# Patient Record
Sex: Female | Born: 1970 | Race: White | Hispanic: No | Marital: Married | Smoking: Never smoker
Health system: Southern US, Community
[De-identification: ages and names within clinical notes are randomized; demographics above are authoritative.]

## PROBLEM LIST (undated history)

## (undated) DIAGNOSIS — N200 Calculus of kidney: Secondary | ICD-10-CM

## (undated) DIAGNOSIS — G43909 Migraine, unspecified, not intractable, without status migrainosus: Secondary | ICD-10-CM

## (undated) DIAGNOSIS — E039 Hypothyroidism, unspecified: Secondary | ICD-10-CM

## (undated) DIAGNOSIS — IMO0002 Reserved for concepts with insufficient information to code with codable children: Secondary | ICD-10-CM

## (undated) DIAGNOSIS — Z1231 Encounter for screening mammogram for malignant neoplasm of breast: Secondary | ICD-10-CM

## (undated) DIAGNOSIS — J45909 Unspecified asthma, uncomplicated: Secondary | ICD-10-CM

## (undated) DIAGNOSIS — J984 Other disorders of lung: Secondary | ICD-10-CM

## (undated) DIAGNOSIS — R7989 Other specified abnormal findings of blood chemistry: Secondary | ICD-10-CM

## (undated) DIAGNOSIS — G43009 Migraine without aura, not intractable, without status migrainosus: Secondary | ICD-10-CM

## (undated) DIAGNOSIS — M79645 Pain in left finger(s): Secondary | ICD-10-CM

## (undated) DIAGNOSIS — Z23 Encounter for immunization: Secondary | ICD-10-CM

## (undated) HISTORY — DX: Calculus of kidney: N20.0

## (undated) HISTORY — DX: Migraine, unspecified, not intractable, without status migrainosus: G43.909

## (undated) HISTORY — DX: Hypothyroidism, unspecified: E03.9

## (undated) HISTORY — PX: TONSILLECTOMY: SHX5217

## (undated) HISTORY — PX: CHOLECYSTECTOMY: SHX55

---

## 1999-01-15 ENCOUNTER — Inpatient Hospital Stay (HOSPITAL_COMMUNITY): Admission: AD | Admit: 1999-01-15 | Discharge: 1999-01-15 | Payer: Self-pay | Admitting: Obstetrics and Gynecology

## 1999-01-19 ENCOUNTER — Inpatient Hospital Stay (HOSPITAL_COMMUNITY): Admission: AD | Admit: 1999-01-19 | Discharge: 1999-01-21 | Payer: Self-pay | Admitting: Obstetrics and Gynecology

## 1999-04-12 ENCOUNTER — Encounter (INDEPENDENT_AMBULATORY_CARE_PROVIDER_SITE_OTHER): Payer: Self-pay

## 1999-04-12 ENCOUNTER — Ambulatory Visit (HOSPITAL_BASED_OUTPATIENT_CLINIC_OR_DEPARTMENT_OTHER): Admission: RE | Admit: 1999-04-12 | Discharge: 1999-04-12 | Payer: Self-pay | Admitting: Orthopedic Surgery

## 2000-02-27 ENCOUNTER — Encounter: Admission: RE | Admit: 2000-02-27 | Discharge: 2000-03-07 | Payer: Self-pay | Admitting: Family Medicine

## 2000-03-21 ENCOUNTER — Other Ambulatory Visit: Admission: RE | Admit: 2000-03-21 | Discharge: 2000-03-21 | Payer: Self-pay | Admitting: Obstetrics and Gynecology

## 2000-10-08 ENCOUNTER — Encounter: Payer: Self-pay | Admitting: *Deleted

## 2000-10-08 ENCOUNTER — Encounter: Admission: RE | Admit: 2000-10-08 | Discharge: 2000-10-08 | Payer: Self-pay | Admitting: *Deleted

## 2001-03-25 ENCOUNTER — Other Ambulatory Visit: Admission: RE | Admit: 2001-03-25 | Discharge: 2001-03-25 | Payer: Self-pay | Admitting: Obstetrics and Gynecology

## 2001-10-08 ENCOUNTER — Encounter: Payer: Self-pay | Admitting: Family Medicine

## 2001-10-08 ENCOUNTER — Encounter: Admission: RE | Admit: 2001-10-08 | Discharge: 2001-10-08 | Payer: Self-pay | Admitting: Family Medicine

## 2001-12-02 ENCOUNTER — Ambulatory Visit (HOSPITAL_COMMUNITY): Admission: RE | Admit: 2001-12-02 | Discharge: 2001-12-02 | Payer: Self-pay | Admitting: Surgery

## 2001-12-02 ENCOUNTER — Encounter: Payer: Self-pay | Admitting: Surgery

## 2002-04-09 ENCOUNTER — Other Ambulatory Visit: Admission: RE | Admit: 2002-04-09 | Discharge: 2002-04-09 | Payer: Self-pay | Admitting: Obstetrics and Gynecology

## 2002-06-15 ENCOUNTER — Other Ambulatory Visit: Admission: RE | Admit: 2002-06-15 | Discharge: 2002-06-15 | Payer: Self-pay | Admitting: Obstetrics and Gynecology

## 2002-12-18 ENCOUNTER — Inpatient Hospital Stay (HOSPITAL_COMMUNITY): Admission: AD | Admit: 2002-12-18 | Discharge: 2002-12-19 | Payer: Self-pay | Admitting: Obstetrics and Gynecology

## 2002-12-25 ENCOUNTER — Inpatient Hospital Stay (HOSPITAL_COMMUNITY): Admission: AD | Admit: 2002-12-25 | Discharge: 2002-12-27 | Payer: Self-pay | Admitting: Obstetrics and Gynecology

## 2003-02-05 ENCOUNTER — Other Ambulatory Visit: Admission: RE | Admit: 2003-02-05 | Discharge: 2003-02-05 | Payer: Self-pay | Admitting: Obstetrics and Gynecology

## 2005-11-22 ENCOUNTER — Ambulatory Visit: Payer: Self-pay | Admitting: Family Medicine

## 2005-11-24 ENCOUNTER — Ambulatory Visit: Payer: Self-pay | Admitting: Internal Medicine

## 2005-11-26 ENCOUNTER — Ambulatory Visit: Payer: Self-pay | Admitting: Family Medicine

## 2006-01-03 ENCOUNTER — Ambulatory Visit: Payer: Self-pay | Admitting: Family Medicine

## 2006-05-01 ENCOUNTER — Ambulatory Visit: Payer: Self-pay | Admitting: Internal Medicine

## 2006-05-18 DIAGNOSIS — E039 Hypothyroidism, unspecified: Secondary | ICD-10-CM | POA: Insufficient documentation

## 2006-05-18 DIAGNOSIS — J309 Allergic rhinitis, unspecified: Secondary | ICD-10-CM | POA: Insufficient documentation

## 2006-05-28 ENCOUNTER — Ambulatory Visit: Payer: Self-pay | Admitting: Family Medicine

## 2006-05-28 LAB — CONVERTED CEMR LAB: TSH: 3.34 microintl units/mL (ref 0.35–5.50)

## 2006-06-26 ENCOUNTER — Encounter: Admission: RE | Admit: 2006-06-26 | Discharge: 2006-06-26 | Payer: Self-pay | Admitting: Obstetrics and Gynecology

## 2006-08-20 ENCOUNTER — Ambulatory Visit: Payer: Self-pay | Admitting: Family Medicine

## 2006-12-30 ENCOUNTER — Ambulatory Visit: Payer: Self-pay | Admitting: Family Medicine

## 2006-12-30 DIAGNOSIS — G43009 Migraine without aura, not intractable, without status migrainosus: Secondary | ICD-10-CM | POA: Insufficient documentation

## 2007-01-02 ENCOUNTER — Telehealth (INDEPENDENT_AMBULATORY_CARE_PROVIDER_SITE_OTHER): Payer: Self-pay | Admitting: *Deleted

## 2007-01-03 ENCOUNTER — Encounter: Admission: RE | Admit: 2007-01-03 | Discharge: 2007-01-03 | Payer: Self-pay | Admitting: Family Medicine

## 2007-01-07 ENCOUNTER — Telehealth (INDEPENDENT_AMBULATORY_CARE_PROVIDER_SITE_OTHER): Payer: Self-pay | Admitting: *Deleted

## 2007-01-10 ENCOUNTER — Ambulatory Visit: Payer: Self-pay | Admitting: Family Medicine

## 2007-01-13 ENCOUNTER — Encounter (INDEPENDENT_AMBULATORY_CARE_PROVIDER_SITE_OTHER): Payer: Self-pay | Admitting: Family Medicine

## 2007-01-15 ENCOUNTER — Encounter (INDEPENDENT_AMBULATORY_CARE_PROVIDER_SITE_OTHER): Payer: Self-pay | Admitting: Family Medicine

## 2007-02-25 ENCOUNTER — Encounter (INDEPENDENT_AMBULATORY_CARE_PROVIDER_SITE_OTHER): Payer: Self-pay | Admitting: Family Medicine

## 2007-03-17 ENCOUNTER — Telehealth (INDEPENDENT_AMBULATORY_CARE_PROVIDER_SITE_OTHER): Payer: Self-pay | Admitting: *Deleted

## 2007-05-05 ENCOUNTER — Ambulatory Visit: Payer: Self-pay | Admitting: Family Medicine

## 2007-05-05 ENCOUNTER — Telehealth (INDEPENDENT_AMBULATORY_CARE_PROVIDER_SITE_OTHER): Payer: Self-pay | Admitting: *Deleted

## 2007-06-02 ENCOUNTER — Telehealth (INDEPENDENT_AMBULATORY_CARE_PROVIDER_SITE_OTHER): Payer: Self-pay | Admitting: *Deleted

## 2007-06-11 DIAGNOSIS — K5900 Constipation, unspecified: Secondary | ICD-10-CM | POA: Insufficient documentation

## 2007-06-16 ENCOUNTER — Telehealth (INDEPENDENT_AMBULATORY_CARE_PROVIDER_SITE_OTHER): Payer: Self-pay | Admitting: *Deleted

## 2007-06-27 ENCOUNTER — Telehealth (INDEPENDENT_AMBULATORY_CARE_PROVIDER_SITE_OTHER): Payer: Self-pay | Admitting: *Deleted

## 2007-12-25 ENCOUNTER — Ambulatory Visit: Payer: Self-pay | Admitting: *Deleted

## 2007-12-25 DIAGNOSIS — E669 Obesity, unspecified: Secondary | ICD-10-CM | POA: Insufficient documentation

## 2007-12-25 LAB — CONVERTED CEMR LAB: TSH: 2.51 microintl units/mL (ref 0.35–5.50)

## 2008-01-27 ENCOUNTER — Ambulatory Visit: Payer: Self-pay | Admitting: *Deleted

## 2008-02-02 ENCOUNTER — Ambulatory Visit: Payer: Self-pay | Admitting: Internal Medicine

## 2008-02-02 DIAGNOSIS — R070 Pain in throat: Secondary | ICD-10-CM | POA: Insufficient documentation

## 2008-02-02 DIAGNOSIS — J069 Acute upper respiratory infection, unspecified: Secondary | ICD-10-CM | POA: Insufficient documentation

## 2008-02-06 ENCOUNTER — Telehealth (INDEPENDENT_AMBULATORY_CARE_PROVIDER_SITE_OTHER): Payer: Self-pay | Admitting: *Deleted

## 2008-02-10 ENCOUNTER — Ambulatory Visit: Payer: Self-pay | Admitting: *Deleted

## 2008-02-10 DIAGNOSIS — J45909 Unspecified asthma, uncomplicated: Secondary | ICD-10-CM | POA: Insufficient documentation

## 2008-02-10 DIAGNOSIS — J40 Bronchitis, not specified as acute or chronic: Secondary | ICD-10-CM | POA: Insufficient documentation

## 2008-02-20 ENCOUNTER — Ambulatory Visit: Payer: Self-pay | Admitting: Internal Medicine

## 2008-02-20 ENCOUNTER — Ambulatory Visit (HOSPITAL_BASED_OUTPATIENT_CLINIC_OR_DEPARTMENT_OTHER): Admission: RE | Admit: 2008-02-20 | Discharge: 2008-02-20 | Payer: Self-pay | Admitting: Internal Medicine

## 2008-06-07 ENCOUNTER — Ambulatory Visit: Payer: Self-pay | Admitting: *Deleted

## 2008-06-07 DIAGNOSIS — J329 Chronic sinusitis, unspecified: Secondary | ICD-10-CM | POA: Insufficient documentation

## 2008-11-08 ENCOUNTER — Ambulatory Visit: Payer: Self-pay | Admitting: Internal Medicine

## 2008-11-08 DIAGNOSIS — J029 Acute pharyngitis, unspecified: Secondary | ICD-10-CM | POA: Insufficient documentation

## 2008-12-26 ENCOUNTER — Emergency Department (HOSPITAL_BASED_OUTPATIENT_CLINIC_OR_DEPARTMENT_OTHER): Admission: EM | Admit: 2008-12-26 | Discharge: 2008-12-26 | Payer: Self-pay | Admitting: Emergency Medicine

## 2008-12-26 ENCOUNTER — Ambulatory Visit: Payer: Self-pay | Admitting: Diagnostic Radiology

## 2009-01-03 ENCOUNTER — Encounter: Payer: Self-pay | Admitting: Internal Medicine

## 2009-01-11 ENCOUNTER — Telehealth: Payer: Self-pay | Admitting: Internal Medicine

## 2009-01-17 ENCOUNTER — Encounter: Payer: Self-pay | Admitting: Internal Medicine

## 2009-01-17 LAB — CONVERTED CEMR LAB: TSH: 3.431 microintl units/mL (ref 0.350–4.500)

## 2009-01-18 ENCOUNTER — Ambulatory Visit: Payer: Self-pay | Admitting: Internal Medicine

## 2009-01-20 ENCOUNTER — Ambulatory Visit: Payer: Self-pay | Admitting: Internal Medicine

## 2009-03-22 ENCOUNTER — Telehealth: Payer: Self-pay | Admitting: Internal Medicine

## 2009-04-05 ENCOUNTER — Encounter: Payer: Self-pay | Admitting: Internal Medicine

## 2009-04-05 LAB — CONVERTED CEMR LAB
AST: 15 units/L (ref 0–37)
Albumin: 4 g/dL (ref 3.5–5.2)
Alkaline Phosphatase: 76 units/L (ref 39–117)
Calcium: 9.2 mg/dL (ref 8.4–10.5)
Chloride: 104 meq/L (ref 96–112)
Creatinine, Ser: 0.68 mg/dL (ref 0.40–1.20)
HDL: 44 mg/dL (ref >39)
LDL Cholesterol: 79 mg/dL (ref 0–99)
Sodium: 140 meq/L (ref 135–145)
TSH: 0.577 microintl units/mL (ref 0.350–4.500)
Total CHOL/HDL Ratio: 4
Total Protein: 6.8 g/dL (ref 6.0–8.3)
Triglycerides: 276 mg/dL — ABNORMAL HIGH (ref <150)

## 2009-04-13 ENCOUNTER — Telehealth (INDEPENDENT_AMBULATORY_CARE_PROVIDER_SITE_OTHER): Payer: Self-pay | Admitting: *Deleted

## 2009-04-21 ENCOUNTER — Ambulatory Visit: Payer: Self-pay | Admitting: Internal Medicine

## 2009-04-21 DIAGNOSIS — M79609 Pain in unspecified limb: Secondary | ICD-10-CM | POA: Insufficient documentation

## 2009-04-27 ENCOUNTER — Ambulatory Visit (HOSPITAL_BASED_OUTPATIENT_CLINIC_OR_DEPARTMENT_OTHER): Admission: RE | Admit: 2009-04-27 | Discharge: 2009-04-27 | Payer: Self-pay | Admitting: Internal Medicine

## 2009-04-27 ENCOUNTER — Ambulatory Visit: Payer: Self-pay | Admitting: Diagnostic Radiology

## 2009-04-28 ENCOUNTER — Telehealth: Payer: Self-pay | Admitting: Internal Medicine

## 2009-08-16 ENCOUNTER — Telehealth: Payer: Self-pay | Admitting: Internal Medicine

## 2009-08-16 ENCOUNTER — Ambulatory Visit: Payer: Self-pay | Admitting: Internal Medicine

## 2009-12-29 ENCOUNTER — Encounter: Admission: RE | Admit: 2009-12-29 | Discharge: 2009-12-29 | Payer: Self-pay | Admitting: Obstetrics and Gynecology

## 2010-01-05 ENCOUNTER — Encounter: Admission: RE | Admit: 2010-01-05 | Discharge: 2010-01-05 | Payer: Self-pay | Admitting: Obstetrics and Gynecology

## 2010-01-26 ENCOUNTER — Encounter: Payer: Self-pay | Admitting: Internal Medicine

## 2010-01-27 ENCOUNTER — Encounter: Payer: Self-pay | Admitting: Internal Medicine

## 2010-02-02 ENCOUNTER — Telehealth: Payer: Self-pay | Admitting: Internal Medicine

## 2010-05-16 IMAGING — CR DG CHEST 2V
2 series · 2 of 2 positions shown · non-contrast
Comparison: None

CLINICAL DATA: Cough for several weeks

CHEST - 2 VIEW

[w chest pa]
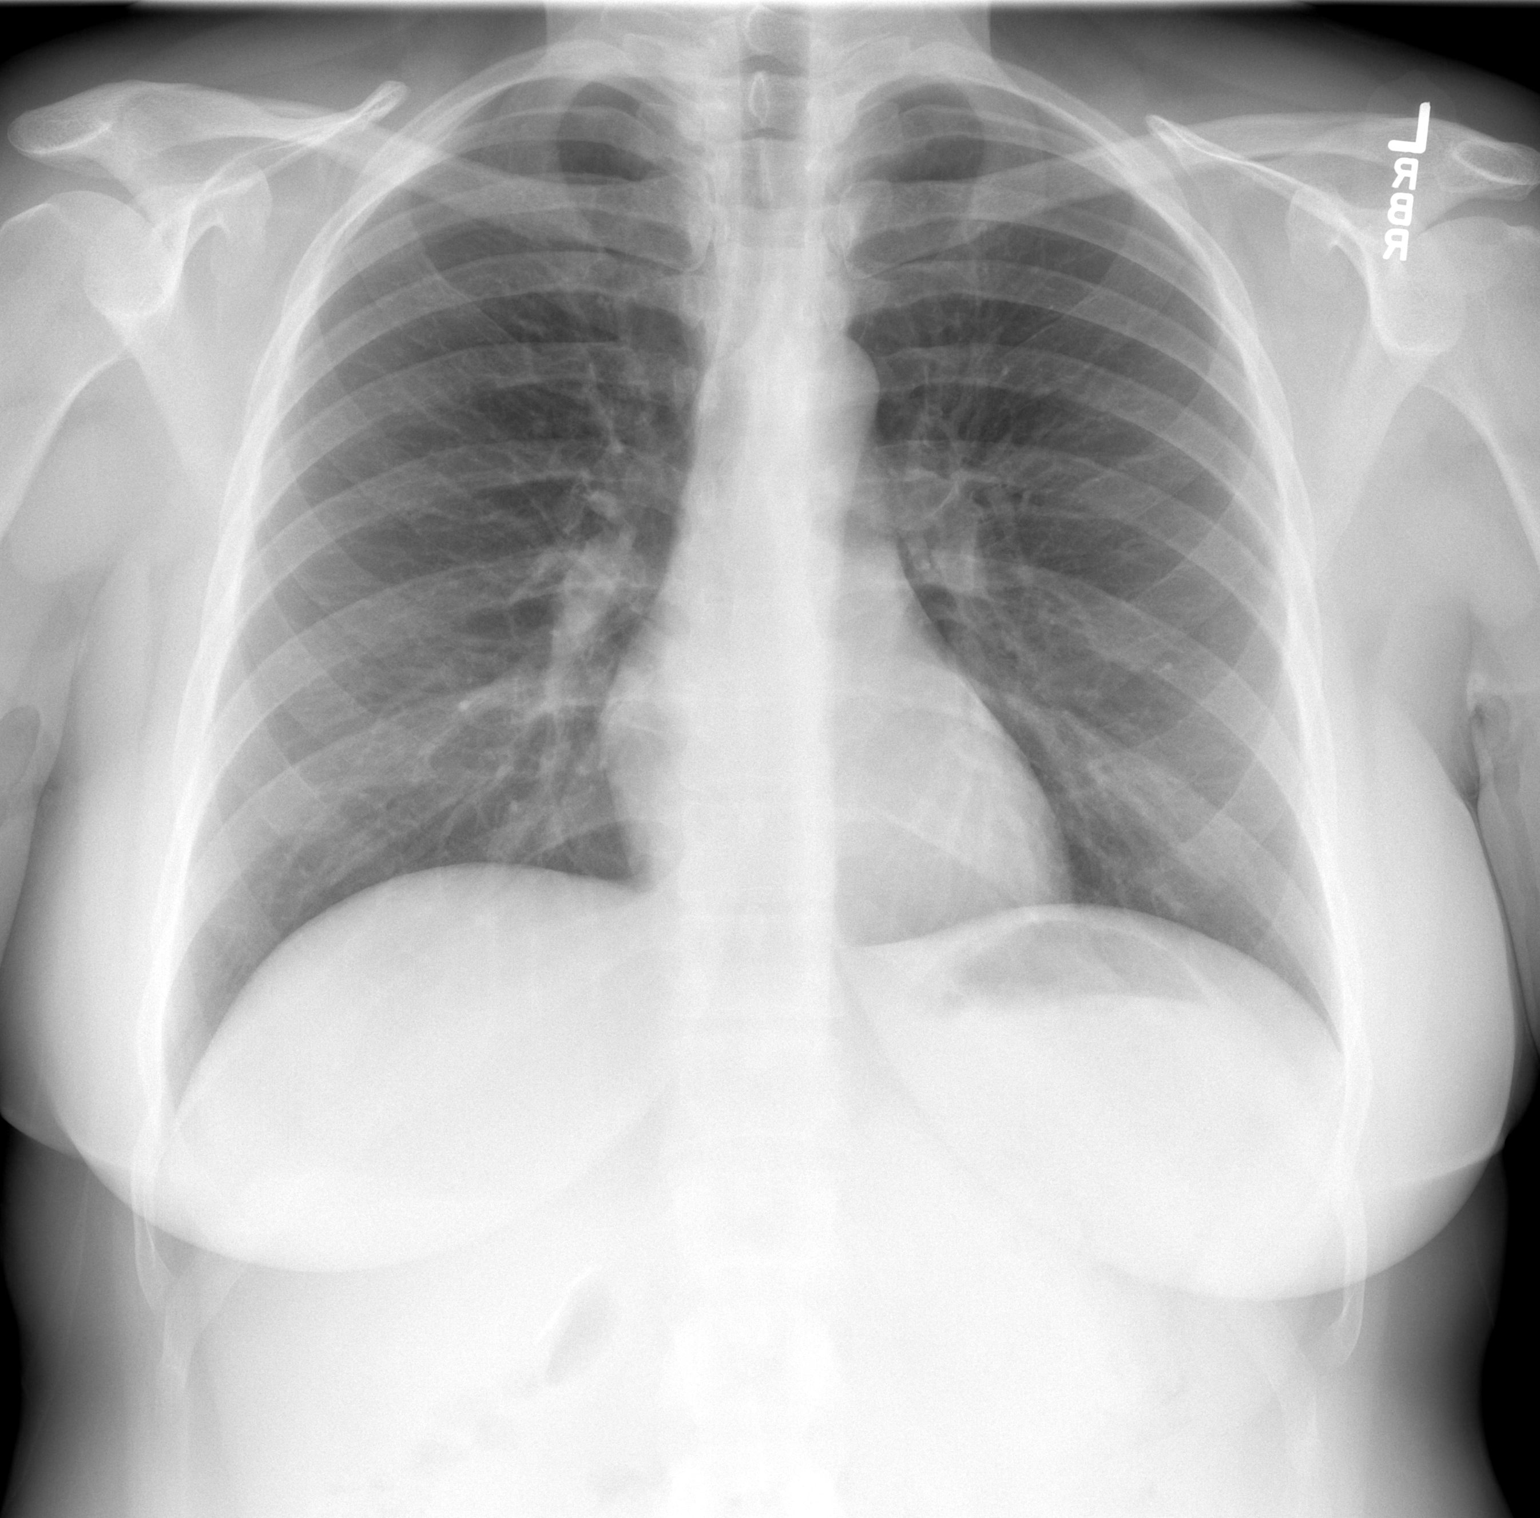

[w chest lat]
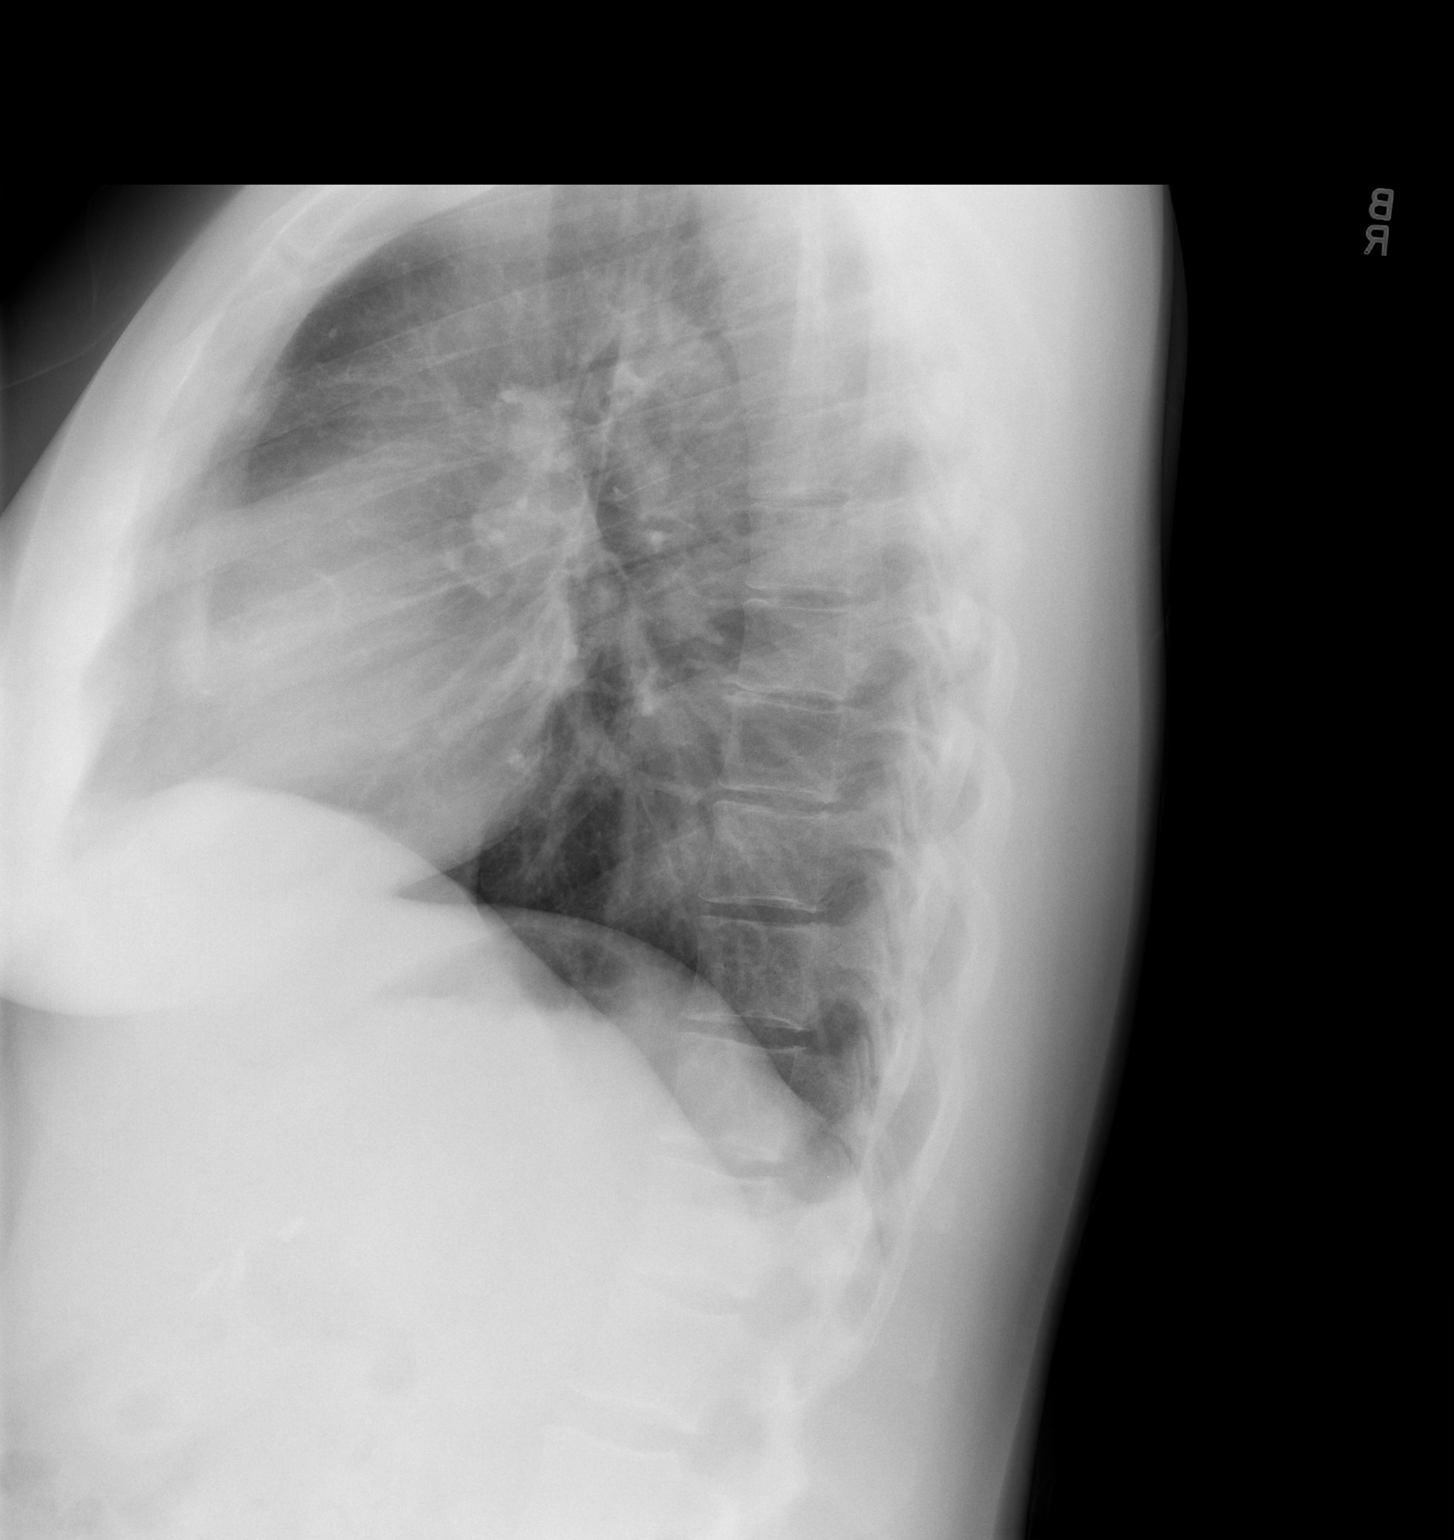

[2 of 2 positions shown; findings below may reference images not displayed]

FINDINGS: No active infiltrate or effusion is seen.  The heart is
within normal limits in size.  No bony abnormality is noted.
Surgical clips are present in the right upper quadrant from prior
cholecystectomy.
IMPRESSION: No active lung disease.

## 2010-05-23 NOTE — Assessment & Plan Note (Signed)
Summary: MEDICATION REFILL/HEA   Vital Signs:  Patient profile:   40 year old female Height:      62 inches Weight:      192.25 pounds BMI:     35.29 O2 Sat:      100 % on Room air Temp:     98.4 degrees F oral Pulse rate:   80 / minute Pulse rhythm:   regular Resp:     16 per minute BP sitting:   106 / 80  (right arm) Cuff size:   large  Vitals Entered By: Glendell Docker CMA (August 16, 2009 8:57 AM)  O2 Flow:  Room air CC: Rm 2- Follow up disease management   Primary Care Provider:  Dondra Spry DO  CC:  Rm 2- Follow up disease management.  History of Present Illness: 40 y/o white female with hypothyroidism for f.u pt frustrated with wt gain busy family life  heel pain - manageable.    migraine headache - usually infreq.  sometimes induced by exercise   Allergies: 1)  ! 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   Lungs:  normal respiratory effort and normal breath sounds.   Heart:  normal rate, regular rhythm, and no gallop.   Extremities:  No lower extremity edema    Impression & Recommendations:  Problem # 1:  HEEL PAIN, LEFT (ICD-729.5) Assessment Unchanged MRI showed heel spur.  She tries her best to use heel inserts.  pain is stable.  orthopedic referral deferred.  Problem # 2:  HYPOTHYROIDISM (ICD-244.9) she is frustrated by wt gain. monitor TSH.  wt loss strategies  reviewed.  Her updated medication list for this problem includes:    Synthroid 100 Mcg Tabs (Levothyroxine sodium) ..... 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  pt advised to try taking 400-600 mg of ibuprofen before exercise.  Her updated medication list for this problem includes:    Maxalt 10 Mg Tabs (Rizatriptan benzoate) .Marland Kitchen... Take one tablet at onset of headache.  Complete Medication List: 1)  Synthroid 100 Mcg Tabs (Levothyroxine sodium) .... One by mouth qd 2)  Zyrtec Allergy 10 Mg Tabs (Cetirizine hcl) .... Take 1 tablet by mouth once a day 3)  Maxalt 10 Mg Tabs (Rizatriptan benzoate) .... Take one tablet at onset of headache. 4)  Femcon Fe 0.4-35 Mg-mcg Chew (Norethin-eth estradiol-fe) .... Take 1 tablet by mouth once a day  Patient Instructions: 1)  Please schedule a follow-up appointment in 1 year. 2)  [email or web address] 3)  Schedule TSH every 6 months Prescriptions: SYNTHROID 100 MCG TABS (LEVOTHYROXINE SODIUM) one by mouth qd Brand medically necessary #90 x 3   Entered and Authorized by:   D. Thomos Lemons DO   Signed by:   D. Thomos Lemons DO on 08/16/2009   Method used:   Electronically to  [facility] (retail)       [address]       [place], [place]  [address]       Ph: [phone]       Fax: [phone]   RxID:   [id number]    Immunization History:  Tetanus/Td Immunization History:    Tetanus/Td:  historical (01/09/2005)     Preventive Care Screening  Pap Smear:    Date:  09/07/2008    Results:  normal   Last Tetanus Booster:    Date:  01/09/2005    Results:  Historical     Current Allergies (reviewed today): ! CODEINE

## 2010-05-23 NOTE — Progress Notes (Signed)
Summary: Lab Results  Phone Note Outgoing Call   Summary of Call: call pt - thyroid blood testing normal. cont same dose .   repeat TSH in 6 months Initial call taken by: D. Thomos Lemons DO,  February 02, 2010 3:13 PM  Follow-up for Phone Call        call placed to patient at  [phone] no answer. A voice message was left for patient to return call. Follow-up by: Glendell Docker CMA,  February 03, 2010 9:03 AM  Additional Follow-up for Phone Call Additional follow up Details #1::        Notified pt of lab result and scheduled repeat TSH for the week of 07/31/10 in [facility]. Order sent to the lab.  Pt was given lab hours.  Nicki Guadalajara Fergerson CMA Duncan Dull)  February 03, 2010 2:52 PM     Prescriptions: SYNTHROID 100 MCG TABS (LEVOTHYROXINE SODIUM) one by mouth qd Brand medically necessary #90 x 1   Entered and Authorized by:   D. Thomos Lemons DO   Signed by:   D. Thomos Lemons DO on 02/02/2010   Method used:   Electronically to        [facility] (retail)       [address]       [place], [place]  [address]       Ph: [phone]       Fax: [phone]   RxID:   [id number]

## 2010-05-23 NOTE — Miscellaneous (Signed)
Summary: [facility]  [facility]   Imported By: Lanelle Bal 02/02/2010 12:04:51  _____________________________________________________________________  External Attachment:    Type:   Image     Comment:   External Document

## 2010-05-23 NOTE — Progress Notes (Signed)
Summary: Lab Results  Phone Note Outgoing Call   Summary of Call: call pt- thyroid blood test is normal.  continue same dose Initial call taken by: D. Thomos Lemons DO,  August 16, 2009 11:56 PM  Follow-up for Phone Call        call placed to patient at [phone], no answer, voice message to return call regarding results Follow-up by: Glendell Docker CMA,  August 17, 2009 9:11 AM  Additional Follow-up for Phone Call Additional follow up Details #1::        call returned to patient at [phone], she was advised per Dr Artist Pais instructions Refill sent to pharmacy Additional Follow-up by: Glendell Docker CMA,  August 17, 2009 2:45 PM    Prescriptions: SYNTHROID 100 MCG TABS (LEVOTHYROXINE SODIUM) one by mouth qd Brand medically necessary #30 x 6   Entered by:   Glendell Docker CMA   Authorized by:   D. Thomos Lemons DO   Signed by:   Glendell Docker CMA on 08/17/2009   Method used:   Electronically to        [facility] (retail)       [address]       [place], [place]  [address]       Ph: [phone]       Fax: [phone]   RxID:   [id number]

## 2010-05-23 NOTE — Miscellaneous (Signed)
Summary: Flu Vaccine  Clinical Lists Changes  Observations: Added new observation of FLU VAX: Historical (01/26/2010 14:29)      Immunization History:  Influenza Immunization History:    Influenza:  historical (01/26/2010)

## 2010-05-23 NOTE — Progress Notes (Signed)
Summary: Xray Results  Phone Note Outgoing Call   Summary of Call: call pt - MRI of foot negative for osteomyelitis.  It showed heel spur.  I suggest she continue ice, antiflammatories and donut shaped heel insert.  If progressive pain, she will need to follow up with her podiatrist.    Initial call taken by: D. Thomos Lemons DO,  April 28, 2009 5:30 PM  Follow-up for Phone Call        patient advised per Dr Artist Pais instructions, verbalized understanding and agrees. Follow-up by: Glendell Docker CMA,  April 28, 2009 6:02 PM

## 2010-05-23 NOTE — Miscellaneous (Signed)
Summary: Orders Update  Clinical Lists Changes  Orders: Added new Test order of T-TSH ([id number]) - Signed 

## 2010-07-28 LAB — URINE MICROSCOPIC-ADD ON

## 2010-07-28 LAB — BASIC METABOLIC PANEL
CO2: 25 mEq/L (ref 19–32)
Calcium: 9.7 mg/dL (ref 8.4–10.5)
Creatinine, Ser: 0.9 mg/dL (ref 0.4–1.2)
GFR calc Af Amer: >60 mL/min (ref >60)
GFR calc non Af Amer: >60 mL/min (ref >60)

## 2010-07-28 LAB — URINE CULTURE

## 2010-07-28 LAB — URINALYSIS, ROUTINE W REFLEX MICROSCOPIC
Glucose, UA: NEGATIVE mg/dL
Specific Gravity, Urine: 1.024 (ref 1.005–1.030)

## 2010-08-01 ENCOUNTER — Telehealth: Payer: Self-pay | Admitting: *Deleted

## 2010-08-01 NOTE — Telephone Encounter (Signed)
Patient called and wanted to know when she was due for blood work, and if a follow up appointment with Dr Artist Pais was needed. She was informed lab order has been entered for April and she was also due for office visit with Dr Artist Pais. She has verbalized understanding and states she will have the blood work done and call back to schedule follow up with Dr Artist Pais

## 2010-08-15 ENCOUNTER — Other Ambulatory Visit: Payer: Self-pay | Admitting: Internal Medicine

## 2010-08-15 LAB — TSH: TSH: 1.343 u[IU]/mL (ref 0.350–4.500)

## 2010-08-23 ENCOUNTER — Encounter: Payer: Self-pay | Admitting: Internal Medicine

## 2010-08-24 ENCOUNTER — Ambulatory Visit (INDEPENDENT_AMBULATORY_CARE_PROVIDER_SITE_OTHER): Payer: BC Managed Care – PPO | Admitting: Internal Medicine

## 2010-08-24 ENCOUNTER — Encounter: Payer: Self-pay | Admitting: Internal Medicine

## 2010-08-24 VITALS — BP 114/80 | HR 73 | Temp 98.1°F | Resp 18 | Wt 201.0 lb

## 2010-08-24 DIAGNOSIS — E039 Hypothyroidism, unspecified: Secondary | ICD-10-CM

## 2010-08-24 DIAGNOSIS — M79609 Pain in unspecified limb: Secondary | ICD-10-CM

## 2010-08-24 MED ORDER — LEVOTHYROXINE SODIUM 100 MCG PO TABS: 100.0000 ug | ORAL_TABLET | Freq: Every day | ORAL | Status: DC

## 2010-08-24 NOTE — Patient Instructions (Addendum)
[facility] - foot specialist, Dr. Toni Arthurs [phone] My Fitness Pal  - 1000-1200 calorie diet Please complete the following lab tests before your next follow up appointment: TSH,  Thyroid antibodies - 244.9

## 2010-08-24 NOTE — Progress Notes (Signed)
  Subjective:    Patient ID: Cynthia Curry, female    DOB: 1970-06-29, 40 y.o.   MRN: [id number]  HPI 40 y/o white female for f/u re:   Hypothyroidism.  Overall, doing well.  Some weight gain over last 1-2 yrs.  She tries to eat healthy and exercise regularly. Denies constipation, cold intolerance.      Review of Systems She is still struggling with chronic plantar fasciitis which limits her ability to walk.    Past Medical History  Diagnosis Date  . Allergic rhinitis   . Constipation   . 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 Asthma     only during pregnancy and URI's  . Hypothyroidism     History   Social History  . Marital Status: Married    Spouse Name: N/A    Number of Children: N/A  . Years of Education: N/A   Occupational History  . Not on file.   Social History Main Topics  . Smoking status: Never Smoker   . Smokeless tobacco: Not on file  . Alcohol Use: Not on file  . Drug Use: Not on file  . Sexually Active: Not on file   Other Topics Concern  . 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 Cholecystectomy   . Tonsillectomy     Family History  Problem Relation Age of Onset  . Arthritis    . Diabetes    . Hyperlipidemia    . Hypertension    . Lung cancer      Allergies  Allergen Reactions  . Codeine     REACTION: severe vomiting    Current Outpatient Prescriptions on File Prior to Visit  Medication Sig Dispense Refill  . cetirizine (ZYRTEC) 10 MG tablet Take 10 mg by mouth daily.        Kathrynn Running Estradiol-Fe [facility] FE) 0.4-35 MG-MCG CHEW Chew by mouth daily.        . rizatriptan (MAXALT) 10 MG tablet Take 10 mg by mouth as needed. May repeat in 2 hours if needed         BP 114/80  Pulse 73  Temp(Src) 98.1 F (36.7 C) (Oral)  Resp 18  Wt 201 lb (91.173 kg)  SpO2 100%  LMP  08/08/2010    Objective:   Physical Exam    Constitutional: Appears well-developed and well-nourished. No distress.  Mouth/Throat: Oropharynx is clear and moist.  Neck: Normal range of motion. Neck supple. No thyromegaly present. No carotid bruit Cardiovascular: Normal rate, regular rhythm and normal heart sounds.  Exam reveals no gallop and no friction rub.   No murmur heard. Pulmonary/Chest: Effort normal and breath sounds normal.  No wheezes. No rales.  Abdominal: Soft. Bowel sounds are normal. No mass. There is no tenderness.  Neurological: Alert. No cranial nerve deficit.  Skin: Skin is warm and dry.  Psychiatric: Normal mood and affect. Behavior is normal.    Assessment & Plan:

## 2010-09-08 NOTE — Assessment & Plan Note (Signed)
[facility]                                   ON-CALL NOTE   NAME:[name], [name]                      MRN:          [id number]  DATE:12/11/2005                            DOB:          [birth year]    NOTE:  Called from [phone].  She called at 6:34 p.m. on December 11, 2005,  complaining of migraine headache.  She states she called about 6 a.m. this  morning.  She has had a headache since the exam this morning.  She called  the office earlier yesterday to get a refill on her Maxault and it had not  been called in as of the time when she called the service.  Maxault 10 mg  #10 with no refills was called in to [facility].                                   Lelon Perla, DO   YRL/MedQ  DD:  12/11/2005  DT:  12/12/2005  Job #:  [id number]   cc:   Leanne Chang, MD

## 2010-11-08 NOTE — Assessment & Plan Note (Signed)
Stable.  Continue same dose levothyroxine. Lab Results  Component Value Date   TSH 1.343 08/15/2010   Monitor TSH q 6 months to 1 year

## 2010-11-08 NOTE — Assessment & Plan Note (Signed)
Pt with chronic heel pain from plantar fasciitis.  Pt advised to follow up with ortho .   Provided contact info for [facility] at [facility].

## 2010-11-29 ENCOUNTER — Other Ambulatory Visit: Payer: Self-pay | Admitting: Obstetrics and Gynecology

## 2010-11-29 DIAGNOSIS — Z1231 Encounter for screening mammogram for malignant neoplasm of breast: Secondary | ICD-10-CM

## 2011-01-09 ENCOUNTER — Ambulatory Visit: Payer: BC Managed Care – PPO

## 2011-02-15 ENCOUNTER — Ambulatory Visit
Admission: RE | Admit: 2011-02-15 | Discharge: 2011-02-15 | Disposition: A | Discharge status: Home / Self Care | Payer: BC Managed Care – PPO | Source: Ambulatory Visit | Attending: Obstetrics and Gynecology | Admitting: Obstetrics and Gynecology

## 2011-02-15 DIAGNOSIS — Z1231 Encounter for screening mammogram for malignant neoplasm of breast: Secondary | ICD-10-CM

## 2011-03-02 ENCOUNTER — Ambulatory Visit (INDEPENDENT_AMBULATORY_CARE_PROVIDER_SITE_OTHER): Payer: BC Managed Care – PPO | Admitting: Family

## 2011-03-02 DIAGNOSIS — Z23 Encounter for immunization: Secondary | ICD-10-CM

## 2011-03-22 ENCOUNTER — Encounter: Payer: Self-pay | Admitting: Internal Medicine

## 2011-03-22 ENCOUNTER — Ambulatory Visit (INDEPENDENT_AMBULATORY_CARE_PROVIDER_SITE_OTHER): Payer: BC Managed Care – PPO | Admitting: Internal Medicine

## 2011-03-22 VITALS — BP 104/60 | HR 102 | Temp 98.3°F | Resp 18 | Wt 174.0 lb

## 2011-03-22 DIAGNOSIS — J40 Bronchitis, not specified as acute or chronic: Secondary | ICD-10-CM

## 2011-03-22 LAB — POCT INFLUENZA A/B: Influenza B, POC: NEGATIVE

## 2011-03-22 MED ORDER — CHLORPHENIRAMINE-HYDROCODONE 8-10 MG/5ML PO LQCR: 5.0000 mL | Freq: Two times a day (BID) | ORAL | Status: DC | PRN

## 2011-03-23 ENCOUNTER — Ambulatory Visit: Payer: BC Managed Care – PPO | Admitting: Internal Medicine

## 2011-03-25 NOTE — Assessment & Plan Note (Signed)
Flu swab neg. Attempt tussionex prn cough. Cautioned re possible sedating effect. Followup if no improvement or worsening.

## 2011-03-25 NOTE — Progress Notes (Signed)
  Subjective:    Patient ID: Cynthia Curry, female    DOB: 01-22-1971, 40 y.o.   MRN: [id number]  HPI Pt presents to clinic for evaluation of cough. Notes 2 d h/o NP cough and myalgias. No f/c, n/v. Taking otc cold medication without improvement. No sick exp. No alleviating or exacerbating factors. No other complaints.  Past Medical History  Diagnosis Date  . Allergic rhinitis   . Constipation   . 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 Asthma     only during pregnancy and URI's  . Hypothyroidism    Past Surgical History  Procedure Date  . Cholecystectomy   . Tonsillectomy     reports that she has never smoked. She has never used smokeless tobacco. Her alcohol and drug histories not on file. family history includes Arthritis in an unspecified family member; Diabetes in an unspecified family member; Hyperlipidemia in an unspecified family member; Hypertension in an unspecified family member; and Lung cancer in an unspecified family member. Allergies  Allergen Reactions  . Codeine     REACTION: severe vomiting     Review of Systems see hpi     Objective:   Physical Exam  Nursing note and vitals reviewed. Constitutional: She appears well-developed and well-nourished. No distress.  HENT:  Head: Normocephalic and atraumatic.  Right Ear: Tympanic membrane, external ear and ear canal normal.  Left Ear: Tympanic membrane, external ear and ear canal normal.  Nose: Nose normal.  Mouth/Throat: Oropharynx is clear and moist. No oropharyngeal exudate.  Eyes: Conjunctivae are normal. Right eye exhibits no discharge. Left eye exhibits no discharge. No scleral icterus.  Neck: Neck supple.  Neurological: She is alert.  Skin: Skin is warm. She is not diaphoretic.  Psychiatric: She has a normal mood and affect.          Assessment & Plan:

## 2011-03-30 ENCOUNTER — Ambulatory Visit: Payer: BC Managed Care – PPO | Admitting: Internal Medicine

## 2011-03-30 ENCOUNTER — Encounter: Payer: Self-pay | Admitting: Internal Medicine

## 2011-03-30 ENCOUNTER — Ambulatory Visit (INDEPENDENT_AMBULATORY_CARE_PROVIDER_SITE_OTHER): Payer: BC Managed Care – PPO | Admitting: Internal Medicine

## 2011-03-30 DIAGNOSIS — J40 Bronchitis, not specified as acute or chronic: Secondary | ICD-10-CM

## 2011-03-30 MED ORDER — AMOXICILLIN-POT CLAVULANATE 875-125 MG PO TABS: 1.0000 | ORAL_TABLET | Freq: Two times a day (BID) | ORAL | Status: DC

## 2011-03-31 NOTE — Progress Notes (Signed)
  Subjective:    Patient ID: Cynthia Curry, female    DOB: [birth year], 40 y.o.   MRN: [id number]  HPI Pt presents to clinic for evaluation of cough. Notes 8+day h/o NP cough, fever and chest congestion. No chills or ST. Taking otc medication without improvement. No other alleviating or exacerbating factors. No other complaints.  Past Medical History  Diagnosis Date  . Allergic rhinitis   . Constipation   . 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 Asthma     only during pregnancy and URI's  . Hypothyroidism    Past Surgical History  Procedure Date  . Cholecystectomy   . Tonsillectomy     reports that she has never smoked. She has never used smokeless tobacco. Her alcohol and drug histories not on file. family history includes Arthritis in an unspecified family member; Diabetes in an unspecified family member; Hyperlipidemia in an unspecified family member; Hypertension in an unspecified family member; and Lung cancer in an unspecified family member. Allergies  Allergen Reactions  . Codeine     REACTION: severe vomiting       Review of Systems see hpi     Objective:   Physical Exam  Nursing note and vitals reviewed. Constitutional: She appears well-developed and well-nourished. No distress.  HENT:  Head: Normocephalic and atraumatic.  Mouth/Throat: No oropharyngeal exudate.  Eyes: Conjunctivae are normal. No scleral icterus.  Neck: Neck supple.  Cardiovascular: Normal rate, regular rhythm and normal heart sounds.  Exam reveals no gallop and no friction rub.   No murmur heard. Pulmonary/Chest: Effort normal and breath sounds normal.  Neurological: She is alert.  Skin: Skin is warm and dry. She is not diaphoretic.          Assessment & Plan:

## 2011-03-31 NOTE — Assessment & Plan Note (Signed)
Begin augmentin. Followup if no improvement or worsening.

## 2011-04-06 ENCOUNTER — Encounter: Payer: Self-pay | Admitting: Internal Medicine

## 2011-04-06 ENCOUNTER — Ambulatory Visit (INDEPENDENT_AMBULATORY_CARE_PROVIDER_SITE_OTHER): Payer: BC Managed Care – PPO | Admitting: Internal Medicine

## 2011-04-06 ENCOUNTER — Ambulatory Visit (HOSPITAL_BASED_OUTPATIENT_CLINIC_OR_DEPARTMENT_OTHER)
Admission: RE | Admit: 2011-04-06 | Discharge: 2011-04-06 | Disposition: A | Discharge status: Home / Self Care | Payer: BC Managed Care – PPO | Source: Ambulatory Visit | Attending: Internal Medicine | Admitting: Internal Medicine

## 2011-04-06 VITALS — BP 90/60 | HR 85 | Temp 98.0°F | Resp 18

## 2011-04-06 DIAGNOSIS — R05 Cough: Secondary | ICD-10-CM | POA: Insufficient documentation

## 2011-04-06 DIAGNOSIS — J45909 Unspecified asthma, uncomplicated: Secondary | ICD-10-CM

## 2011-04-06 DIAGNOSIS — R059 Cough, unspecified: Secondary | ICD-10-CM

## 2011-04-06 MED ORDER — LEVOFLOXACIN 500 MG PO TABS: 500.0000 mg | ORAL_TABLET | Freq: Every day | ORAL | Status: AC

## 2011-04-06 MED ORDER — METHYLPREDNISOLONE (PAK) 4 MG PO TABS: ORAL_TABLET | ORAL | Status: AC

## 2011-04-06 MED ORDER — METHYLPREDNISOLONE ACETATE 40 MG/ML IJ SUSP
40.0000 mg | Freq: Once | INTRAMUSCULAR | Status: AC
  Administered 2011-04-06: 40 mg via INTRAMUSCULAR

## 2011-04-06 MED ORDER — ALBUTEROL 90 MCG/ACT IN AERS: 2.0000 | INHALATION_SPRAY | Freq: Four times a day (QID) | RESPIRATORY_TRACT | Status: DC | PRN

## 2011-04-06 NOTE — Progress Notes (Signed)
  Subjective:    Patient ID: Cynthia Curry, female    DOB: 06/06/1970, 40 y.o.   MRN: [id number]  HPI Pt presents to clinic for evaluation of persistent cough. Recently completed 7d course of augmentin without side effect. Had transient improvement only. Has persistent cough with possible intermittent subjective wheezing. No recent f/c. Has poorly controlled cough despite tussionex. Known h/o past asthma. Attempted albuterol mdi but it may be expired. No other alleviating or exacerbating factors. No other complaints.  Past Medical History  Diagnosis Date  . Allergic rhinitis   . Constipation   . 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 Asthma     only during pregnancy and URI's  . Hypothyroidism    Past Surgical History  Procedure Date  . Cholecystectomy   . Tonsillectomy     reports that she has never smoked. She has never used smokeless tobacco. Her alcohol and drug histories not on file. family history includes Arthritis in an unspecified family member; Diabetes in an unspecified family member; Hyperlipidemia in an unspecified family member; Hypertension in an unspecified family member; and Lung cancer in an unspecified family member. Allergies  Allergen Reactions  . Codeine     REACTION: severe vomiting     Review of Systems see hpi     Objective:   Physical Exam  Nursing note and vitals reviewed. Constitutional: She appears well-developed and well-nourished. No distress.  HENT:  Head: Normocephalic and atraumatic.  Right Ear: External ear normal.  Left Ear: External ear normal.  Eyes: Conjunctivae are normal. Right eye exhibits no discharge. Left eye exhibits no discharge. No scleral icterus.  Neck: Neck supple.  Pulmonary/Chest: Effort normal and breath sounds normal. No respiratory distress. She has no wheezes. She has no rales.  Neurological: She is alert.  Skin: Skin is warm and dry. She is not diaphoretic.  Psychiatric: She has a normal mood  and affect.          Assessment & Plan:

## 2011-04-06 NOTE — Assessment & Plan Note (Signed)
Exacerbation. Attempt depomedrol 40mg  im and medrol dosepak. Refill albuterol mdi prn. Followup if no improvement or worsening.

## 2011-04-06 NOTE — Assessment & Plan Note (Signed)
Obtain cxr r/o infiltrate. Attempt 7d course of levaquin. Followup if no improvement or worsening.

## 2011-04-09 ENCOUNTER — Telehealth: Payer: Self-pay | Admitting: Internal Medicine

## 2011-04-09 NOTE — Telephone Encounter (Signed)
Patient called wanting to know her chest x -ray results from Friday

## 2011-04-09 NOTE — Telephone Encounter (Signed)
Call placed to patient at [phone], she was informed of chest xray normal.

## 2011-04-13 ENCOUNTER — Telehealth: Payer: Self-pay | Admitting: *Deleted

## 2011-04-13 MED ORDER — FLUTICASONE-SALMETEROL 250-50 MCG/DOSE IN AEPB: 1.0000 | INHALATION_SPRAY | Freq: Two times a day (BID) | RESPIRATORY_TRACT | Status: DC

## 2011-04-13 NOTE — Telephone Encounter (Signed)
pls add advair 250 one inhalation bid. Rinse mouth afterwards. #1 no rf.

## 2011-04-13 NOTE — Telephone Encounter (Signed)
Patient called and left voice message stating she has completed the antibiotic and prednisone yesterday. She states she still has shortness of breath and would like to know what Dr Rodena Medin Advises.  Call returned to patient at [phone].  She states she still has a dry cough, wheezing and shortness of breath. She denies temperature.  She has used the Inhaler with some improvement.

## 2011-04-13 NOTE — Telephone Encounter (Signed)
Call placed to patient she was advised of Rx per Dr Rodena Medin. Patient advised to call back if no improvement.

## 2011-05-08 ENCOUNTER — Ambulatory Visit (INDEPENDENT_AMBULATORY_CARE_PROVIDER_SITE_OTHER): Payer: BC Managed Care – PPO | Admitting: Internal Medicine

## 2011-05-08 ENCOUNTER — Encounter: Payer: Self-pay | Admitting: Internal Medicine

## 2011-05-08 VITALS — BP 100/60 | HR 97 | Temp 98.4°F | Resp 16 | Wt 166.0 lb

## 2011-05-08 DIAGNOSIS — J020 Streptococcal pharyngitis: Secondary | ICD-10-CM

## 2011-05-08 DIAGNOSIS — J029 Acute pharyngitis, unspecified: Secondary | ICD-10-CM

## 2011-05-08 LAB — POCT RAPID STREP A (OFFICE): Rapid Strep A Screen: POSITIVE — AB

## 2011-05-08 MED ORDER — AMOXICILLIN 875 MG PO TABS: 875.0000 mg | ORAL_TABLET | Freq: Two times a day (BID) | ORAL | Status: AC

## 2011-05-13 DIAGNOSIS — J029 Acute pharyngitis, unspecified: Secondary | ICD-10-CM | POA: Insufficient documentation

## 2011-05-13 NOTE — Assessment & Plan Note (Signed)
Rapid strep positive. Begin abx to completion. Followup if no improvement or worsening.

## 2011-05-13 NOTE — Progress Notes (Signed)
  Subjective:    Patient ID: Cynthia Curry, female    DOB: 1970/06/11, 41 y.o.   MRN: [id number]  HPI Pt presents to clinic for evaluation of ST. Notes 2 day h/o ST, myalgias and fever with tmax100 without chills, cough, or ha. No alleviating or exacerbating factors. Taking no medication for the problem. No other complaints.  Past Medical History  Diagnosis Date  . Allergic rhinitis   . Constipation   . 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 Asthma     only during pregnancy and URI's  . Hypothyroidism    Past Surgical History  Procedure Date  . Cholecystectomy   . Tonsillectomy     reports that she has never smoked. She has never used smokeless tobacco. Her alcohol and drug histories not on file. family history includes Arthritis in an unspecified family member; Diabetes in an unspecified family member; Hyperlipidemia in an unspecified family member; Hypertension in an unspecified family member; and Lung cancer in an unspecified family member. Allergies  Allergen Reactions  . Codeine     REACTION: severe vomiting     Review of Systems see hpi      Objective:   Physical Exam  Nursing note and vitals reviewed. Constitutional: She appears well-developed and well-nourished. No distress.  HENT:  Head: Normocephalic and atraumatic.  Right Ear: External ear normal.  Left Ear: External ear normal.  Nose: Nose normal.  Mouth/Throat: Oropharynx is clear and moist. No oropharyngeal exudate.  Eyes: Conjunctivae are normal. Right eye exhibits no discharge. Left eye exhibits no discharge. No scleral icterus.  Neck: Neck supple.  Lymphadenopathy:    She has cervical adenopathy.  Neurological: She is alert.  Skin: Skin is warm and dry. She is not diaphoretic.  Psychiatric: She has a normal mood and affect.          Assessment & Plan:

## 2011-05-29 ENCOUNTER — Ambulatory Visit: Payer: BC Managed Care – PPO | Admitting: Family

## 2011-09-19 ENCOUNTER — Telehealth: Payer: Self-pay | Admitting: Internal Medicine

## 2011-09-19 DIAGNOSIS — E039 Hypothyroidism, unspecified: Secondary | ICD-10-CM

## 2011-09-19 MED ORDER — LEVOTHYROXINE SODIUM 100 MCG PO TABS: 100.0000 ug | ORAL_TABLET | Freq: Every day | ORAL | Status: DC

## 2011-09-19 NOTE — Telephone Encounter (Signed)
Refill- synthroid tab. Take one tablet by mouth one time daily. Qty 90 last fill 2.11.13

## 2011-09-19 NOTE — Telephone Encounter (Signed)
Rx refill sent to pharmacy. 

## 2011-09-28 ENCOUNTER — Ambulatory Visit (INDEPENDENT_AMBULATORY_CARE_PROVIDER_SITE_OTHER): Payer: BC Managed Care – PPO | Admitting: Internal Medicine

## 2011-09-28 ENCOUNTER — Encounter: Payer: Self-pay | Admitting: Internal Medicine

## 2011-09-28 VITALS — BP 100/70 | HR 77 | Temp 98.4°F | Resp 16 | Ht 62.0 in | Wt 150.0 lb

## 2011-09-28 DIAGNOSIS — E039 Hypothyroidism, unspecified: Secondary | ICD-10-CM

## 2011-09-28 DIAGNOSIS — Z79899 Other long term (current) drug therapy: Secondary | ICD-10-CM

## 2011-09-28 DIAGNOSIS — E785 Hyperlipidemia, unspecified: Secondary | ICD-10-CM

## 2011-09-28 LAB — HEPATIC FUNCTION PANEL
ALT: 68 U/L — ABNORMAL HIGH (ref 0–35)
AST: 58 U/L — ABNORMAL HIGH (ref 0–37)
Alkaline Phosphatase: 104 U/L (ref 39–117)
Indirect Bilirubin: 0.5 mg/dL (ref 0.0–0.9)
Total Protein: 6.9 g/dL (ref 6.0–8.3)

## 2011-09-28 LAB — BASIC METABOLIC PANEL
BUN: 16 mg/dL (ref 6–23)
Calcium: 9.6 mg/dL (ref 8.4–10.5)
Creat: 0.73 mg/dL (ref 0.50–1.10)
Glucose, Bld: 84 mg/dL (ref 70–99)

## 2011-09-28 LAB — LIPID PANEL
Cholesterol: 212 mg/dL — ABNORMAL HIGH (ref 0–200)
Triglycerides: 117 mg/dL (ref <150)
VLDL: 23 mg/dL (ref 0–40)

## 2011-09-28 LAB — T4, FREE: Free T4: 1.11 ng/dL (ref 0.80–1.80)

## 2011-09-28 LAB — TSH: TSH: 3.144 u[IU]/mL (ref 0.350–4.500)

## 2011-09-28 MED ORDER — LEVOTHYROXINE SODIUM 100 MCG PO TABS: 100.0000 ug | ORAL_TABLET | Freq: Every day | ORAL | Status: DC

## 2011-09-28 NOTE — Patient Instructions (Signed)
Please schedule tsh/free t4 prior to next visit (hypothyroidism) 

## 2011-09-30 DIAGNOSIS — E785 Hyperlipidemia, unspecified: Secondary | ICD-10-CM | POA: Insufficient documentation

## 2011-09-30 NOTE — Assessment & Plan Note (Signed)
Obtain lipid/lft. 

## 2011-09-30 NOTE — Assessment & Plan Note (Signed)
Obtain tsh/ft4 

## 2011-09-30 NOTE — Progress Notes (Signed)
  Subjective:    Patient ID: Cynthia Curry, female    DOB: 11-18-1970, 41 y.o.   MRN: [id number]  HPI Pt presents to clinic for followup of multiple medical problems. Migraines occur mainly with menstration. maxalt helps. H/o mild hyperlipidemia not requiring medication. Has intentionally lost ~50lbs.  Past Medical History  Diagnosis Date  . Allergic rhinitis   . Constipation   . 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 Asthma     only during pregnancy and URI's  . Hypothyroidism    Past Surgical History  Procedure Date  . Cholecystectomy   . Tonsillectomy     reports that she has never smoked. She has never used smokeless tobacco. Her alcohol and drug histories not on file. family history includes Arthritis in an unspecified family member; Diabetes in an unspecified family member; Hyperlipidemia in an unspecified family member; Hypertension in an unspecified family member; and Lung cancer in an unspecified family member. Allergies  Allergen Reactions  . Codeine     REACTION: severe vomiting      Review of Systems see hpi     Objective:   Physical Exam  Nursing note and vitals reviewed. Constitutional: She appears well-developed and well-nourished.  HENT:  Head: Normocephalic and atraumatic.  Eyes: Conjunctivae are normal. No scleral icterus.  Neck: Neck supple. No thyromegaly present.  Cardiovascular: Normal rate, regular rhythm and normal heart sounds.  Exam reveals no gallop and no friction rub.   No murmur heard. Pulmonary/Chest: Effort normal and breath sounds normal. No respiratory distress. She has no wheezes. She has no rales.  Neurological: She is alert.  Psychiatric: She has a normal mood and affect.          Assessment & Plan:

## 2011-10-01 ENCOUNTER — Other Ambulatory Visit: Payer: Self-pay | Admitting: Internal Medicine

## 2011-10-01 DIAGNOSIS — E039 Hypothyroidism, unspecified: Secondary | ICD-10-CM

## 2011-10-08 ENCOUNTER — Other Ambulatory Visit: Payer: Self-pay | Admitting: Internal Medicine

## 2011-10-08 DIAGNOSIS — R945 Abnormal results of liver function studies: Secondary | ICD-10-CM

## 2011-11-07 LAB — HEPATIC FUNCTION PANEL
ALT: 45 U/L — ABNORMAL HIGH (ref 0–35)
Albumin: 4.1 g/dL (ref 3.5–5.2)
Total Protein: 6.6 g/dL (ref 6.0–8.3)

## 2011-11-07 NOTE — Progress Notes (Signed)
Pt presented to the lab and orders were released. 

## 2011-11-07 NOTE — Addendum Note (Signed)
Addended by: Mervin Kung A on: 11/07/2011 08:53 AM   Modules accepted: Orders

## 2011-11-13 ENCOUNTER — Telehealth: Payer: Self-pay | Admitting: Internal Medicine

## 2011-11-13 DIAGNOSIS — E039 Hypothyroidism, unspecified: Secondary | ICD-10-CM

## 2011-11-13 DIAGNOSIS — R945 Abnormal results of liver function studies: Secondary | ICD-10-CM

## 2011-11-13 NOTE — Telephone Encounter (Signed)
Patient is requesting last lab results 

## 2011-11-13 NOTE — Telephone Encounter (Signed)
See lab result note.

## 2011-11-14 NOTE — Telephone Encounter (Signed)
RESULT Note: LFT improved further. One is now nl. The other is only minimally above nl. Recommend another lft check in 1 month dx-abn lft/TWH  Patient informed, understood & agreed; lab order placed/SLS

## 2012-01-15 ENCOUNTER — Other Ambulatory Visit: Payer: Self-pay | Admitting: Obstetrics and Gynecology

## 2012-01-15 DIAGNOSIS — Z1231 Encounter for screening mammogram for malignant neoplasm of breast: Secondary | ICD-10-CM

## 2012-01-30 ENCOUNTER — Ambulatory Visit (INDEPENDENT_AMBULATORY_CARE_PROVIDER_SITE_OTHER): Payer: BC Managed Care – PPO

## 2012-01-30 DIAGNOSIS — Z23 Encounter for immunization: Secondary | ICD-10-CM

## 2012-02-18 ENCOUNTER — Ambulatory Visit
Admission: RE | Admit: 2012-02-18 | Discharge: 2012-02-18 | Disposition: A | Discharge status: Home / Self Care | Payer: BC Managed Care – PPO | Source: Ambulatory Visit | Attending: Obstetrics and Gynecology | Admitting: Obstetrics and Gynecology

## 2012-02-18 DIAGNOSIS — Z1231 Encounter for screening mammogram for malignant neoplasm of breast: Secondary | ICD-10-CM

## 2012-03-26 NOTE — Telephone Encounter (Signed)
Lab orders released/SLS 

## 2012-03-26 NOTE — Addendum Note (Signed)
Addended by: Regis Bill on: 03/26/2012 03:56 PM   Modules accepted: Orders

## 2012-03-27 LAB — HEPATIC FUNCTION PANEL
ALT: 48 U/L — ABNORMAL HIGH (ref 0–35)
AST: 33 U/L (ref 0–37)
Alkaline Phosphatase: 73 U/L (ref 39–117)
Bilirubin, Direct: 0.1 mg/dL (ref 0.0–0.3)

## 2012-04-01 ENCOUNTER — Ambulatory Visit: Payer: BC Managed Care – PPO | Admitting: Internal Medicine

## 2012-04-01 ENCOUNTER — Encounter: Payer: Self-pay | Admitting: Internal Medicine

## 2012-04-01 ENCOUNTER — Ambulatory Visit (INDEPENDENT_AMBULATORY_CARE_PROVIDER_SITE_OTHER): Payer: BC Managed Care – PPO | Admitting: Internal Medicine

## 2012-04-01 VITALS — BP 110/78 | HR 85 | Wt 154.0 lb

## 2012-04-01 DIAGNOSIS — R748 Abnormal levels of other serum enzymes: Secondary | ICD-10-CM

## 2012-04-01 DIAGNOSIS — R7401 Elevation of levels of liver transaminase levels: Secondary | ICD-10-CM

## 2012-04-01 DIAGNOSIS — E039 Hypothyroidism, unspecified: Secondary | ICD-10-CM

## 2012-04-01 LAB — FERRITIN: Ferritin: 38 ng/mL (ref 10–291)

## 2012-04-01 NOTE — Assessment & Plan Note (Signed)
Well controlled. Continue current dosing.

## 2012-04-01 NOTE — Progress Notes (Signed)
  Subjective:    Patient ID: Cynthia Curry, female    DOB: 05-21-1970, 41 y.o.   MRN: [id number]  HPI Pt presents to clinic for followup of multiple medical problems. States no recent problems with migraine headaches. No asthma exacerbations recently. Reviewed nl tsh/free t4 with h/o hypothyroidism. Reviewed minimal persistent elevation of ALT. Denies abd pain, nausea or pale stool.   Past Medical History  Diagnosis Date  . Allergic rhinitis   . Constipation   . 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 Asthma     only during pregnancy and URI's  . Hypothyroidism    Past Surgical History  Procedure Date  . Cholecystectomy   . Tonsillectomy     reports that she has never smoked. She has never used smokeless tobacco. Her alcohol and drug histories not on file. family history includes Arthritis in an unspecified family member; Diabetes in an unspecified family member; Hyperlipidemia in an unspecified family member; Hypertension in an unspecified family member; and Lung cancer in an unspecified family member. Allergies  Allergen Reactions  . Codeine     REACTION: severe vomiting      Review of Systems see hpi     Objective:   Physical Exam  Physical Exam  Nursing note and vitals reviewed. Constitutional: Appears well-developed and well-nourished. No distress.  HENT:  Head: Normocephalic and atraumatic.  Right Ear: External ear normal.  Left Ear: External ear normal.  Eyes: Conjunctivae are normal. No scleral icterus.  Neck: Neck supple. Carotid bruit is not present.  Cardiovascular: Normal rate, regular rhythm and normal heart sounds.  Exam reveals no gallop and no friction rub.   No murmur heard. Pulmonary/Chest: Effort normal and breath sounds normal. No respiratory distress. He has no wheezes. no rales.  Lymphadenopathy:    He has no cervical adenopathy.  Neurological:Alert.  Skin: Skin is warm and dry. Not diaphoretic.  Psychiatric: Has a normal  mood and affect.        Assessment & Plan:

## 2012-04-01 NOTE — Patient Instructions (Signed)
Please schedule fasting labs prior to your next visit Tsh/free t4-hypothyroidism, lft-abn lft and chem7-v58.69

## 2012-04-01 NOTE — Assessment & Plan Note (Signed)
Mild elevations and asx. Obtain hepatitis panel and ferritin level. abd ct 2010 demonstrated nl appearing liver.

## 2012-04-02 LAB — HEPATITIS PANEL, ACUTE
HCV Ab: NEGATIVE
Hepatitis B Surface Ag: NEGATIVE

## 2012-04-03 ENCOUNTER — Telehealth: Payer: Self-pay | Admitting: Internal Medicine

## 2012-04-04 ENCOUNTER — Telehealth: Payer: Self-pay | Admitting: *Deleted

## 2012-04-04 NOTE — Telephone Encounter (Signed)
Notes Recorded by Verdene Rio, CMA on 04/02/2012 at 4:57 PM Left message to call office  Notes Recorded by Edwyna Perfect, MD on 04/02/2012 at 4:34 PM Labs nl  Patient informed/SLS

## 2012-04-04 NOTE — Telephone Encounter (Signed)
Message copied by Regis Bill on Fri Apr 04, 2012  2:56 PM ------      Message from: [facility], FELICIA L      Created: Wed Apr 02, 2012  4:57 PM       Left message to call office

## 2012-04-04 NOTE — Telephone Encounter (Signed)
Patient informed/SLS  

## 2012-04-08 ENCOUNTER — Ambulatory Visit: Payer: BC Managed Care – PPO | Admitting: Internal Medicine

## 2012-09-28 ENCOUNTER — Other Ambulatory Visit: Payer: Self-pay | Admitting: Internal Medicine

## 2012-10-03 ENCOUNTER — Telehealth: Payer: Self-pay | Admitting: Neurology

## 2012-10-06 ENCOUNTER — Ambulatory Visit: Payer: BC Managed Care – PPO | Admitting: Internal Medicine

## 2012-10-06 ENCOUNTER — Telehealth: Payer: Self-pay

## 2012-10-06 DIAGNOSIS — E785 Hyperlipidemia, unspecified: Secondary | ICD-10-CM

## 2012-10-06 DIAGNOSIS — R945 Abnormal results of liver function studies: Secondary | ICD-10-CM

## 2012-10-06 DIAGNOSIS — Z79899 Other long term (current) drug therapy: Secondary | ICD-10-CM

## 2012-10-06 DIAGNOSIS — E039 Hypothyroidism, unspecified: Secondary | ICD-10-CM

## 2012-10-06 DIAGNOSIS — R748 Abnormal levels of other serum enzymes: Secondary | ICD-10-CM

## 2012-10-06 NOTE — Telephone Encounter (Signed)
Lab order placed.

## 2012-10-07 ENCOUNTER — Ambulatory Visit: Payer: Self-pay | Admitting: Nurse Practitioner

## 2012-10-07 LAB — BASIC METABOLIC PANEL
CO2: 25 mEq/L (ref 19–32)
Calcium: 9.1 mg/dL (ref 8.4–10.5)
Chloride: 104 mEq/L (ref 96–112)
Glucose, Bld: 84 mg/dL (ref 70–99)
Potassium: 4.8 mEq/L (ref 3.5–5.3)
Sodium: 137 mEq/L (ref 135–145)

## 2012-10-07 LAB — HEPATIC FUNCTION PANEL
AST: 37 U/L (ref 0–37)
Albumin: 3.8 g/dL (ref 3.5–5.2)
Bilirubin, Direct: 0.1 mg/dL (ref 0.0–0.3)
Total Bilirubin: 0.5 mg/dL (ref 0.3–1.2)

## 2012-10-07 LAB — T4, FREE: Free T4: 1.19 ng/dL (ref 0.80–1.80)

## 2012-10-08 ENCOUNTER — Ambulatory Visit: Payer: BC Managed Care – PPO | Admitting: Family Medicine

## 2012-10-17 ENCOUNTER — Ambulatory Visit (INDEPENDENT_AMBULATORY_CARE_PROVIDER_SITE_OTHER): Payer: BC Managed Care – PPO | Admitting: Family Medicine

## 2012-10-17 ENCOUNTER — Encounter: Payer: Self-pay | Admitting: Family Medicine

## 2012-10-17 ENCOUNTER — Telehealth: Payer: Self-pay

## 2012-10-17 VITALS — BP 120/60 | HR 86 | Temp 98.5°F | Ht 62.0 in | Wt 165.1 lb

## 2012-10-17 DIAGNOSIS — E039 Hypothyroidism, unspecified: Secondary | ICD-10-CM

## 2012-10-17 DIAGNOSIS — E785 Hyperlipidemia, unspecified: Secondary | ICD-10-CM

## 2012-10-17 DIAGNOSIS — G43909 Migraine, unspecified, not intractable, without status migrainosus: Secondary | ICD-10-CM

## 2012-10-17 DIAGNOSIS — N200 Calculus of kidney: Secondary | ICD-10-CM

## 2012-10-17 DIAGNOSIS — G43009 Migraine without aura, not intractable, without status migrainosus: Secondary | ICD-10-CM

## 2012-10-17 MED ORDER — CLOTRIMAZOLE-BETAMETHASONE 1-0.05 % EX CREA: TOPICAL_CREAM | Freq: Two times a day (BID) | CUTANEOUS | Status: AC

## 2012-10-17 MED ORDER — RIZATRIPTAN BENZOATE 10 MG PO TABS: 10.0000 mg | ORAL_TABLET | ORAL | Status: AC | PRN

## 2012-10-17 NOTE — Telephone Encounter (Signed)
PA for patients Maxalt put on MD's desk

## 2012-10-17 NOTE — Patient Instructions (Signed)
Need TSH, freeT4, CBC, Lipid in roughly 3 months Then in 6 months annual exam with labs prior lipid, renal, cbc, hepatic, tsh, free t4  Krill oil caps, MegaRed caps daily   Fatty Liver Fatty liver is the accumulation of fat in liver cells. It is also called hepatosteatosis or steatohepatitis. It is normal for your liver to contain some fat. If fat is more than 5 to 10% of your liver's weight, you have fatty liver.  There are often no symptoms (problems) for years while damage is still occurring. People often learn about their fatty liver when they have medical tests for other reasons. Fat can damage your liver for years or even decades without causing problems. When it becomes severe, it can cause fatigue, weight loss, weakness, and confusion. This makes you more likely to develop more serious liver problems. The liver is the largest organ in the body. It does a lot of work and often gives no warning signs when it is sick until late in a disease. The liver has many important jobs including:  Breaking down foods.  Storing vitamins, iron, and other minerals.  Making proteins.  Making bile for food digestion.  Breaking down many products including medications, alcohol and some poisons. CAUSES  There are a number of different conditions, medications, and poisons that can cause a fatty liver. Eating too many calories causes fat to build up in the liver. Not processing and breaking fats down normally may also cause this. Certain conditions, such as obesity, diabetes, and high triglycerides also cause this. Most fatty liver patients tend to be middle-aged and over weight.  Some causes of fatty liver are:  Alcohol over consumption.  Malnutrition.  Steroid use.  Valproic acid toxicity.  Obesity.  Cushing's syndrome.  Poisons.  Tetracycline in high dosages.  Pregnancy.  Diabetes.  Hyperlipidemia.  Rapid weight loss. Some people develop fatty liver even having none of these  conditions. SYMPTOMS  Fatty liver most often causes no problems. This is called asymptomatic.  It can be diagnosed with blood tests and also by a liver biopsy.  It is one of the most common causes of minor elevations of liver enzymes on routine blood tests.  Specialized Imaging of the liver using ultrasound, CT (computed tomography) scan, or MRI (magnetic resonance imaging) can suggest a fatty liver but a biopsy is needed to confirm it.  A biopsy involves taking a small sample of liver tissue. This is done by using a needle. It is then looked at under a microscope by a specialist. TREATMENT  It is important to treat the cause. Simple fatty liver without a medical reason may not need treatment.  Weight loss, fat restriction, and exercise in overweight patients produces inconsistent results but is worth trying.  Fatty liver due to alcohol toxicity may not improve even with stopping drinking.  Good control of diabetes may reduce fatty liver.  Lower your triglycerides through diet, medication or both.  Eat a balanced, healthy diet.  Increase your physical activity.  Get regular checkups from a liver specialist.  There are no medical or surgical treatments for a fatty liver or NASH, but improving your diet and increasing your exercise may help prevent or reverse some of the damage. PROGNOSIS  Fatty liver may cause no damage or it can lead to an inflammation of the liver. This is, called steatohepatitis. When it is linked to alcohol abuse, it is called alcoholic steatohepatitis. It often is not linked to alcohol. It is then called  nonalcoholic steatohepatitis, or NASH. Over time the liver may become scarred and hardened. This condition is called cirrhosis. Cirrhosis is serious and may lead to liver failure or cancer. NASH is one of the leading causes of cirrhosis. About 10-20% of Americans have fatty liver and a smaller 2-5% has NASH. Document Released: 05/25/2005 Document Revised:  07/02/2011 Document Reviewed: 07/18/2005 [facility] Patient Information 2014 [place], [place].

## 2012-10-19 ENCOUNTER — Encounter: Payer: Self-pay | Admitting: Family Medicine

## 2012-10-19 DIAGNOSIS — N200 Calculus of kidney: Secondary | ICD-10-CM | POA: Insufficient documentation

## 2012-10-19 HISTORY — DX: Calculus of kidney: N20.0

## 2012-10-19 NOTE — Assessment & Plan Note (Signed)
Treated on current dose of levothyroxine. No changes. TSH suppressed but free T4 within normal limits

## 2012-10-19 NOTE — Assessment & Plan Note (Signed)
Is on Topamax but stopped it due to some kidney stones. Is having very infrequent migraines and they do respond to Maxalt. She is given a refill. Encouraged good hydration and by mouth intake

## 2012-10-19 NOTE — Assessment & Plan Note (Signed)
No further episodes since quitting Topamax. Encouraged adequate hydration

## 2012-10-19 NOTE — Progress Notes (Signed)
Patient ID: Cynthia Curry, female   DOB: [birth year], 42 y.o.   MRN: [id number] EIRENE RATHER [id number] 09-22-1970 10/19/2012      Progress Note-Follow Up  Subjective  Chief Complaint  Chief Complaint  Patient presents with  . Follow-up    6 month    HPI  Patient is a 42 year old Caucasian female who is in today in followup. She's having very infrequent migraines despite quitting her Topamax. She develop kidney stones: The Topamax so she stopped it and has had no recurrence since. She uses Maxalt with good results as needed. No other major complaints feeling well. No recent illness. No fevers, chills, chest pain, palpitations, congestion, GI or GU concerns noted.  Past Medical History  Diagnosis Date  . Allergic rhinitis   . Constipation   . 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 Asthma     only during pregnancy and URI's  . Hypothyroidism   . Kidney stone 10/19/2012    Past Surgical History  Procedure Laterality Date  . Cholecystectomy    . Tonsillectomy      Family History  Problem Relation Age of Onset  . Arthritis    . Diabetes    . Hyperlipidemia    . Hypertension    . Lung cancer      History   Social History  . Marital Status: Married    Spouse Name: N/A    Number of Children: N/A  . Years of Education: N/A   Occupational History  . Not on file.   Social History Main Topics  . Smoking status: Never Smoker   . Smokeless tobacco: Never Used  . Alcohol Use: Not on file  . Drug Use: Not on file  . Sexually Active: Not on file   Other Topics Concern  . Not on file   Social History Narrative   Occupation: Manufacturing systems engineer   Married-16 years   3 children 47, 48, 7   Never Smoked       Current Outpatient Prescriptions on File Prior to Visit  Medication Sig Dispense Refill  . albuterol (PROVENTIL,VENTOLIN) 90 MCG/ACT inhaler Inhale 2 puffs into the lungs every 6 (six) hours as needed.      . cetirizine (ZYRTEC) 10 MG  tablet Take 10 mg by mouth daily.        Marland Kitchen levonorgestrel-ethinyl estradiol (LUTERA) 0.1-20 MG-MCG tablet Take 1 tablet by mouth daily.        Marland Kitchen levothyroxine (SYNTHROID) 100 MCG tablet Take 1 tablet (100 mcg total) by mouth daily before breakfast.  30 tablet  0   No current facility-administered medications on file prior to visit.    Allergies  Allergen Reactions  . Codeine     REACTION: severe vomiting    Review of Systems  Review of Systems  Constitutional: Negative for fever and malaise/fatigue.  HENT: Negative for congestion.   Eyes: Negative for pain and discharge.  Respiratory: Negative for shortness of breath.   Cardiovascular: Negative for chest pain, palpitations and leg swelling.  Gastrointestinal: Negative for nausea, abdominal pain and diarrhea.  Genitourinary: Negative for dysuria.  Musculoskeletal: Negative for falls.  Skin: Negative for rash.  Neurological: Negative for loss of consciousness and headaches.  Endo/Heme/Allergies: Negative for polydipsia.  Psychiatric/Behavioral: Negative for depression and suicidal ideas. The patient is not nervous/anxious and does not have insomnia.     Objective  BP 120/60  Pulse 86  Temp(Src) 98.5 F (36.9 C) (Oral)  Ht  5\' 2"  (1.575 m)  Wt 165 lb 1.3 oz (74.88 kg)  BMI 30.19 kg/m2  SpO2 97%  LMP 09/25/2012  Physical Exam  Physical Exam  Constitutional: She is oriented to person, place, and time and well-developed, well-nourished, and in no distress. No distress.  HENT:  Head: Normocephalic and atraumatic.  Eyes: Conjunctivae are normal.  Neck: Neck supple. No thyromegaly present.  Cardiovascular: Normal rate and regular rhythm.  Exam reveals no gallop.   No murmur heard. Pulmonary/Chest: Effort normal and breath sounds normal. She has no wheezes.  Abdominal: She exhibits no distension and no mass.  Musculoskeletal: She exhibits no edema.  Lymphadenopathy:    She has no cervical adenopathy.  Neurological: She  is alert and oriented to person, place, and time.  Skin: Skin is warm and dry. No rash noted. She is not diaphoretic.  Psychiatric: Memory, affect and judgment normal.    Lab Results  Component Value Date   TSH 0.126* 10/06/2012   No results found for this basename: WBC, HGB, HCT, MCV, PLT   Lab Results  Component Value Date   CREATININE 0.61 10/06/2012   BUN 14 10/06/2012   NA 137 10/06/2012   K 4.8 10/06/2012   CL 104 10/06/2012   CO2 25 10/06/2012   Lab Results  Component Value Date   ALT 49* 10/06/2012   AST 37 10/06/2012   ALKPHOS 69 10/06/2012   BILITOT 0.5 10/06/2012   Lab Results  Component Value Date   CHOL 212* 09/28/2011   Lab Results  Component Value Date   HDL 44 09/28/2011   Lab Results  Component Value Date   LDLCALC 145* 09/28/2011   Lab Results  Component Value Date   TRIG 117 09/28/2011   Lab Results  Component Value Date   CHOLHDL 4.8 09/28/2011      Assessment & Plan  COMMON MIGRAINE Is on Topamax but stopped it due to some kidney stones. Is having very infrequent migraines and they do respond to Maxalt. She is given a refill. Encouraged good hydration and by mouth intake  Other and unspecified hyperlipidemia Mild, avoid tight fats. Add Krill oil. Increase exercise.  Kidney stone No further episodes since quitting Topamax. Encouraged adequate hydration  Unspecified hypothyroidism Treated on current dose of levothyroxine. No changes. TSH suppressed but free T4 within normal limits

## 2012-10-19 NOTE — Assessment & Plan Note (Signed)
Mild, avoid tight fats. Add Krill oil. Increase exercise.

## 2012-10-20 NOTE — Telephone Encounter (Signed)
Spoke w/pt and received information as to what prior medications and results were; faxed PA form to [facility] at [phone] for approval/SLS

## 2012-10-21 NOTE — Telephone Encounter (Signed)
Received paperwork from [facility] stating a PA is NOT required. Paperwork sent to be scanned.

## 2012-10-21 NOTE — Telephone Encounter (Signed)
Pa paperwork faxed to pharmacy

## 2012-11-09 ENCOUNTER — Other Ambulatory Visit: Payer: Self-pay | Admitting: Family Medicine

## 2012-11-11 ENCOUNTER — Telehealth: Payer: Self-pay

## 2012-11-11 MED ORDER — LEVOTHYROXINE SODIUM 100 MCG PO TABS: 100.0000 ug | ORAL_TABLET | Freq: Every day | ORAL | Status: DC

## 2012-11-11 NOTE — Telephone Encounter (Signed)
Pt left a message stating that her synthroid didn't have any refills.   I will send in addt refills

## 2013-02-05 NOTE — Progress Notes (Signed)
[name]  [address]  [place], [place] [address]  Phone: [phone] Fax [phone]  Wyvonne Lenz, MD  02/05/2013           Sally [name]  is a 42 y.o.  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  No hives and no shortness of breath no tongue or lip swelling.  She has not used any new soaps or shampoos.  She did have blood work done 3 months ago and her thyroid blood work was normal but her liver function tests were slightly elevated.  She does not have any history of alcohol abuse or  liver toxic medicines.She did lose a lot of weight  But has since gained it back.she would like a flu shot.  Her migraines are well controlled with Maxalt rapidly dissolving tablet.  She uses Zyrtec only when needed for allergies.she would like a mammogram and would like to go to a gynecologist for her gyn care.     Sally [name]  has  has a past medical history of Headache.    Sally [name]  has  has past surgical history that includes cholecystectomy; tonsillectomy; orthopaedic (2011); and orthopaedic (1999).    [name] has a current medication list which includes the following prescription(s): cetirizine, levothyroxine, desogestrel-ethinyl estradiol, rizatriptan, hydrocortisone valerate, and methylprednisolone.  Current Outpatient Prescriptions   Medication Sig Dispense Refill   ??? cetirizine (ZYRTEC) 10 mg tablet Take  by mouth daily.       ??? levothyroxine (SYNTHROID) 100 mcg tablet Take  by mouth Daily (before breakfast).       ??? desogestrel-ethinyl estradiol (APRI) 0.15-30 mg-mcg per tablet Take 1 tablet by mouth daily.       ??? rizatriptan (MAXALT-MLT) 10 mg disintegrating tablet Take 10 mg by mouth once as needed for Migraine.       ??? hydrocortisone valerate (WESTCORT) 0.2 %  ointment Apply  to affected area two (2) times a day. use thin layer  15 g  0   ??? methylprednisolone (MEDROL, PAK,) 4 mg tablet Per dose pack instructions  1 Package  0       [name]   History     Social History   ??? Marital Status: MARRIED     Spouse Name: N/A     Number of Children: N/A   ??? Years of Education: N/A     Social History Main Topics   ??? Smoking status: Never Smoker    ??? Smokeless tobacco: Never Used   ??? Alcohol Use: No   ??? Drug Use: No   ??? Sexually Active: Not on file     Other Topics Concern   ??? Not on file     Social History Narrative   ??? No narrative on file       [name]   Family History   Problem Relation Age of Onset   ??? Hypertension Mother    ??? Diabetes Father    ??? Diabetes Maternal Grandmother    ??? Cancer Maternal Grandmother 50     lung cancer - non smoker   ??? Diabetes Paternal Grandmother    ??? Diabetes Paternal Grandfather    ??? Cancer Maternal Grandfather  cirrhosis         Sally [name]  has the following allergies:   Allergies   Allergen Reactions   ??? Codeine Nausea and Vomiting       BP 132/72   Pulse 80   Temp(Src) 98.5 ??F (36.9 ??C) (Oral)   Resp 16   Ht 5' 1.38" (1.559 m)   Wt 176 lb (79.833 kg)   BMI 32.85 kg/m2   LMP 01/15/2013    HEENT: Normocephalic, atraumatic, pupils equal and reactive to light. Tympanic membranes intact without erythema or edema. Oropharynx clear.   Neck: Supple, no masses or thyromegaly.  Lungs: clear to auscultation bilaterally.  CV: regular rate and rhythm, without murmurs, rubs, or gallops  Abdomen: soft, nontender, nondistended, no masses. No CVAT tenderness.  Ext: No lower extremity edema.  Skin: Red silver dollar-sized patch on right anterior shoulder.   No vesicles.  Small central black dot.  No surrounding skin changes.    Sally [name] was seen today for new patient, immunization/injection and skin problem.    Diagnoses and associated orders for this visit:    Dermatitis  - hydrocortisone valerate (WESTCORT) 0.2 % ointment; Apply  to affected area two  (2) times a day. use thin layer  - methylprednisolone (MEDROL, PAK,) 4 mg tablet; Per dose pack instructions    Elevated LFTs  - LFT ([address])    Need for prophylactic vaccination and inoculation against influenza  - INFLUENZA VIRUS VACCINE, SPLIT, IN INDIVIDS. >=3 YRS OF AGE, IM    Breast screening, unspecified  - MAM MAMMO BI SCREENING DIGTL; Future    If rash is not better after using the cream for 3 days begin Medrol Dosepak.   If not better after that she is to let know.  His liver function tests are elevated we'll get ultrasound.  SHe will need blood work on her thyroid in 1 year for refills.  Louie Bun, MD

## 2013-02-06 LAB — HEPATIC FUNCTION PANEL
ALT (SGPT): 45 IU/L — ABNORMAL HIGH (ref 0–32)
AST (SGOT): 33 IU/L (ref 0–40)
Albumin: 4.2 g/dL (ref 3.5–5.5)
Alk. phosphatase: 82 IU/L (ref 39–117)
Bilirubin, direct: 0.09 mg/dL (ref 0.00–0.40)
Bilirubin, total: 0.2 mg/dL (ref 0.0–1.2)
Protein, total: 6.9 g/dL (ref 6.0–8.5)

## 2013-02-06 NOTE — Progress Notes (Signed)
Quick Note:    Let patient know labs are normal except one liver test, the ALT, was only very slightly elevated at 45 (normal 32 or less). I do not think an ultrasound is needed at this time. Let's recheck these in 3 months. Lose weight.    ______

## 2013-02-06 NOTE — Progress Notes (Signed)
Quick Note:    Pt. Notified.  ______

## 2013-02-06 NOTE — Progress Notes (Signed)
Quick Note:    Left message for pt to call back  ______

## 2013-03-06 NOTE — Progress Notes (Signed)
[facility]  [address]  [place], [place] [address]  Phone: [phone] Fax [phone]  Wyvonne Lenz, MD  03/06/2013           Sally [name]  is a 42 y.o.  year old  female patient who comes in complaining of a 5 day history of cough and wheezing. Some shortness of breath. Coughs all night. Has had sinus and nasal congestion also. Has not used her inhalers without much help - does not have any currently. Has had asthmatic bronchitis before but has never been told she has asthma. Does not smoke. Has a real trouble getting over colds- wheezes a lot. No wheezing with exercise. No fever. No productive sputum. No nasal congestion or pressure.      Sally [name]  has  has a past medical history of Headache.    Sally [name]  has  has past surgical history that includes cholecystectomy; tonsillectomy; orthopaedic (2011); and orthopaedic (1999).    [name] has a current medication list which includes the following prescription(s): prednisone, albuterol, azithromycin, mometasone, cetirizine, levothyroxine, desogestrel-ethinyl estradiol, rizatriptan, and hydrocortisone valerate.  Current Outpatient Prescriptions   Medication Sig Dispense Refill   ??? predniSONE (DELTASONE) 20 mg tablet 3 tabs po qd x 3 days then 2 tabs po qd x 3 days then 1 tab po qd x 3 days.  18 tablet  0   ??? albuterol (PROAIR HFA) 90 mcg/actuation inhaler Take 2 puffs by inhalation every four (4) hours as needed for Wheezing.  1 Inhaler  1   ??? azithromycin (ZITHROMAX Z-PAK) 250 mg tablet Take two tablets today then one tablet daily  6 tablet  0   ??? mometasone (ASMANEX TWISTHALER) 110 mcg (30 doses) aepb inhaler Take 1 puff by inhalation daily.  30 capsule  5   ??? cetirizine (ZYRTEC) 10 mg tablet Take  by mouth daily.       ??? levothyroxine (SYNTHROID) 100 mcg tablet Take  by mouth Daily (before breakfast).       ??? desogestrel-ethinyl estradiol (APRI) 0.15-30 mg-mcg per tablet Take 1 tablet by mouth daily.       ??? rizatriptan  (MAXALT-MLT) 10 mg disintegrating tablet Take 10 mg by mouth once as needed for Migraine.       ??? hydrocortisone valerate (WESTCORT) 0.2 % ointment Apply  to affected area two (2) times a day. use thin layer  15 g  0     Family History   Problem Relation Age of Onset   ??? Hypertension Mother    ??? Diabetes Father    ??? Diabetes Maternal Grandmother    ??? Cancer Maternal Grandmother 7     lung cancer - non smoker   ??? Diabetes Paternal Grandmother    ??? Diabetes Paternal Grandfather    ??? Cancer Maternal Grandfather      cirrhosis     History     Social History   ??? Marital Status: MARRIED     Spouse Name: N/A     Number of Children: N/A   ??? Years of Education: N/A     Occupational History   ??? Not on file.     Social History Main Topics   ??? Smoking status: Never Smoker    ??? Smokeless tobacco: Never Used   ??? Alcohol Use: No   ??? Drug Use: No   ??? Sexually Active: Not on file     Other Topics Concern   ???  Not on file     Social History Narrative   ??? No narrative on file       Sally [name]  has the following allergies:   Allergies   Allergen Reactions   ??? Codeine Nausea and Vomiting       BP 120/70   Pulse 72   Temp(Src) 98.3 ??F (36.8 ??C) (Oral)   Resp 20   Ht 5' 1.37" (1.559 m)   Wt 177 lb (80.287 kg)   BMI 33.03 kg/m2   LMP 02/19/2013    HEENT: Normocephalic, atraumatic, pupils equal and reactive to light. Tympanic membranes intact without erythema or edema. Oropharynx clear.   Neck: Supple, no masses or thyromegaly.  Lungs: faint tight wheezing bilaterally.  CV: regular rate and rhythm, without murmurs, rubs, or gallops  Abdomen: soft, nontender, nondistended, no masses. No CVAT tenderness.  Ext: No lower extremity edema.    Was  given a breathing treatment with  resolution of her wheezing.    Sally [name] was seen today for cough and shortness of breath.    Diagnoses and associated orders for this visit:    Wheezing  - INHALATION TX, ACUTE ([address])  - predniSONE (DELTASONE) 20 mg tablet; 3 tabs po qd x 3 days then 2 tabs po qd x 3  days then 1 tab po qd x 3 days.  - albuterol (PROAIR HFA) 90 mcg/actuation inhaler; Take 2 puffs by inhalation every four (4) hours as needed for Wheezing.  - azithromycin (ZITHROMAX Z-PAK) 250 mg tablet; Take two tablets today then one tablet daily    Asthmatic bronchitis  - mometasone (ASMANEX TWISTHALER) 110 mcg (30 doses) aepb inhaler; Take 1 puff by inhalation daily.      Sample asmanex given. F/u 2 weeks; sooner if worse.    Louie Bun, MD

## 2013-03-23 NOTE — Progress Notes (Signed)
[facility]  [address]  [place], [place] [address]  Phone: [phone] Fax [phone]  Wyvonne Lenz, MD  03/23/2013           Sally [name]  is a 42 y.o.  year old  female patient who comes in for 2 week follow up on an asthmatic bronchitis. She took a Zpak and oral steroids, and was placed on Asmanex twisthaler at 110 mcg and an albuterol inhaler as needed. She is doing better. Cough is almost gone; wheezing is resolved. She last used her albuterol Saturday. Is not having trouble with breathing like she was. Does rinse her mouth out after using the Asmanex.     Has had asthmatic bronchitis before but has never been told she has asthma. Seems to be that the winter makes her flare more. Does not smoke. Has a real trouble getting over colds- wheezes a lot. No wheezing with exercise.     Sally [name]  has  has a past medical history of Headache.    Sally [name]  has  has past surgical history that includes cholecystectomy; tonsillectomy; orthopaedic (2011); and orthopaedic (1999).    [name] has a current medication list which includes the following prescription(s): albuterol, mometasone, cetirizine, levothyroxine, desogestrel-ethinyl estradiol, rizatriptan, and hydrocortisone valerate.  Current Outpatient Prescriptions   Medication Sig Dispense Refill   ??? albuterol (PROAIR HFA) 90 mcg/actuation inhaler Take 2 puffs by inhalation every four (4) hours as needed for Wheezing.  1 Inhaler  1   ??? mometasone (ASMANEX TWISTHALER) 110 mcg (30 doses) aepb inhaler Take 1 puff by inhalation daily.  30 capsule  5   ??? cetirizine (ZYRTEC) 10 mg tablet Take  by mouth daily.       ??? levothyroxine (SYNTHROID) 100 mcg tablet Take  by mouth Daily (before breakfast).       ??? desogestrel-ethinyl estradiol (APRI) 0.15-30 mg-mcg per tablet Take 1 tablet by mouth daily.       ??? rizatriptan (MAXALT-MLT) 10 mg disintegrating tablet Take 10 mg by mouth once as needed for Migraine.       ??? hydrocortisone  valerate (WESTCORT) 0.2 % ointment Apply  to affected area two (2) times a day. use thin layer  15 g  0        [name]   History     Social History   ??? Marital Status: MARRIED     Spouse Name: N/A     Number of Children: N/A   ??? Years of Education: N/A     Social History Main Topics   ??? Smoking status: Never Smoker    ??? Smokeless tobacco: Never Used   ??? Alcohol Use: No   ??? Drug Use: No   ??? Sexually Active: Not on file     Other Topics Concern   ??? Not on file     Social History Narrative   ??? No narrative on file       Sally [name]   Family History   Problem Relation Age of Onset   ??? Hypertension Mother    ??? Diabetes Father    ??? Diabetes Maternal Grandmother    ??? Cancer Maternal Grandmother 18     lung cancer - non smoker   ??? Diabetes Paternal Grandmother    ??? Diabetes Paternal Grandfather    ??? Cancer Maternal Grandfather      cirrhosis         Sally [name]  has the following allergies:   Allergies   Allergen Reactions   ??? Codeine Nausea and Vomiting       BP 122/70   Pulse 76   Temp(Src) 98.3 ??F (36.8 ??C) (Oral)   Resp 20   Ht 5\' 1"  (1.549 m)   Wt 178 lb (80.74 kg)   BMI 33.65 kg/m2   LMP 02/19/2013    HEENT: Normocephalic, atraumatic, pupils equal and reactive to light. Tympanic membranes intact without erythema or edema. Oropharynx clear.   Neck: Supple, no masses or thyromegaly.  Lungs: clear to auscultation bilaterally.  CV: regular rate and rhythm, without murmurs, rubs, or gallops  Abdomen: soft, nontender, nondistended, no masses. No CVAT tenderness.  Ext: No lower extremity edema.    Sally [name] was seen today for follow-up.    Diagnoses and associated orders for this visit:    Wheezing    Asthmatic bronchitis      Given samples of Asmanex inhalers - do one puff daily; then get the rx filled given last time for the 110 mcg Asmanex and do it for another month. Can then stop and see how she does. Use the albuterol inhaler just as needed, but if she is having to use it daily or for any wheezing/shortness of  breath she is to be seen as soon as possible.    Louie Bun, MD

## 2013-04-24 LAB — AMB POC URINALYSIS DIP STICK MANUAL W/ MICRO
Bilirubin (UA POC): NEGATIVE
Blood (UA POC): NEGATIVE
Glucose (UA POC): NEGATIVE
Ketones (UA POC): NEGATIVE
Nitrites (UA POC): NEGATIVE
Protein (UA POC): NEGATIVE mg/dL
Specific gravity (UA POC): 1.015 (ref 1.001–1.035)
Urobilinogen (UA POC): 0.2 (ref 0.2–1)
pH (UA POC): 6 (ref 4.6–8.0)

## 2013-04-24 LAB — AMB POC OCCULT, OTHER SOURCE: Hemoccult (POC): NEGATIVE

## 2013-04-24 LAB — AMB POC HEMOGLOBIN (HGB): Hemoglobin (POC): 12.8

## 2013-04-24 NOTE — Progress Notes (Signed)
HISTORY OF PRESENT ILLNESS  Sally [name] is a 43 y.o. female.  HPI  Patient presents today for a routine gynecological examination with complaints of long period in October. She just moved from [facility].       Menses: Onset:  43 years old. Interval: 28 days. Duration: 10 days. Flow: mod   LMP: Patient's last menstrual period was 04/01/2013.  Contraception: OCP (estrogen/progesterone)  History   Sexual Activity   ??? Sexually Active: Yes -- Female partner(s)   ??? Birth Control/ Protection: Pill       OB History:  Obstetric History     No data available            Allergies:   Allergies   Allergen Reactions   ??? Codeine Nausea and Vomiting       Medication History:  Current Outpatient Prescriptions   Medication Sig Dispense Refill   ??? ZENCHENT, 28, 0.4-35 mg-mcg per tablet Take 1 tablet by mouth daily.  1 Package  11   ??? albuterol (PROAIR HFA) 90 mcg/actuation inhaler Take 2 puffs by inhalation every four (4) hours as needed for Wheezing.  1 Inhaler  1   ??? cetirizine (ZYRTEC) 10 mg tablet Take  by mouth daily.       ??? levothyroxine (SYNTHROID) 100 mcg tablet Take  by mouth Daily (before breakfast).       ??? rizatriptan (MAXALT-MLT) 10 mg disintegrating tablet Take 10 mg by mouth once as needed for Migraine.       ??? oseltamivir (TAMIFLU) 75 mg capsule Take 1 capsule by mouth two (2) times a day for 5 days.  10 capsule  0   ??? mometasone (ASMANEX TWISTHALER) 110 mcg (30 doses) aepb inhaler Take 1 puff by inhalation daily.  30 capsule  5       Past Medical History:  Past Medical History   Diagnosis Date   ??? Headache    ??? Abnormal Pap smear    ??? Calculus of kidney    ??? Thyroid disease        Past Surgical History:  Past Surgical History   Procedure Laterality Date   ??? Hx cholecystectomy     ??? Hx tonsillectomy     ??? Hx orthopaedic  2011     left plantar fasciitis surgery   ??? Hx orthopaedic  1999     left 3rd finger cyst removed       Social History:  History     Social History   ??? Marital Status: MARRIED     Spouse Name: N/A     Number  of Children: N/A   ??? Years of Education: N/A     Occupational History   ??? Not on file.     Social History Main Topics   ??? Smoking status: Never Smoker    ??? Smokeless tobacco: Never Used   ??? Alcohol Use: No   ??? Drug Use: No   ??? Sexually Active: Yes -- Female partner(s)     Birth Control/ Protection: Pill     Other Topics Concern   ??? Not on file     Social History Narrative   ??? No narrative on file       Family History:  Family History   Problem Relation Age of Onset   ??? Hypertension Mother    ??? Thyroid Disease Mother    ??? Elevated Lipids Mother    ??? Gall Bladder Disease Mother    ??? Migraines Mother    ???  Diabetes Father    ??? Diabetes Maternal Grandmother    ??? Cancer Maternal Grandmother 32     lung cancer - non smoker   ??? Lung Disease Maternal Grandmother    ??? Diabetes Paternal Grandmother    ??? Diabetes Paternal Grandfather    ??? Cancer Maternal Grandfather      cirrhosis       BP 110/80   Pulse 90   Ht 5\' 1"  (1.549 m)   Wt 180 lb (81.647 kg)   BMI 34.03 kg/m2   SpO2 98%   LMP 04/01/2013           Review of Systems   Constitutional: Negative.    HENT: Negative.    Eyes: Negative.    Respiratory: Negative.    Cardiovascular: Negative.    Gastrointestinal: Negative.    Genitourinary:        Heavy irr periods   Musculoskeletal: Negative.    Skin: Negative.    Neurological: Negative.    Endo/Heme/Allergies: Negative.    Psychiatric/Behavioral: Negative.        Physical Exam   Vitals reviewed.  Constitutional: She is oriented to person, place, and time. She appears well-developed and well-nourished.   HENT:   Head: Normocephalic and atraumatic.   Eyes: EOM are normal. Pupils are equal, round, and reactive to light.   Neck: Normal range of motion. Neck supple.   Cardiovascular: Normal rate, regular rhythm, S1 normal, S2 normal, normal heart sounds and intact distal pulses.    Pulmonary/Chest: Effort normal and breath sounds normal.   Abdominal: Soft. Bowel sounds are normal. There is no hepatosplenomegaly. There is no  tenderness. There is no rebound, no guarding and no CVA tenderness.   Genitourinary: Rectum normal, vagina normal and uterus normal. Rectal exam shows no mass. Guaiac negative stool. No breast swelling, tenderness, discharge or bleeding. There is no rash, tenderness, lesion or injury on the right labia. There is no rash, tenderness, lesion or injury on the left labia. Cervix exhibits no motion tenderness and no discharge. Right adnexum displays no mass, no tenderness and no fullness. Left adnexum displays no mass, no tenderness and no fullness.   Using the Tanner's scale of sexual maturation, the patient is classified as stage V; breasts have adult shape and areola assumes the same contour, pubic hair has adult female triangle distribution that has spread to the medial surface of the thighs.    External genitalia are normal in appearance..    Examination of urethral meatus reveals location normal and size normal.   Examination of urethra shows no abnormalities.   Examination of vaginal vault reveals no abnormalities.   Cervix is long and closed without pathology.    Uterine portion of bimanual exam reveals contour normal, shape regular, descensus absent, no nodularity, no tenderness and size [redacted] weeks GA.    Adnexa and parametria exam reveals no masses, nontender.    Examination of anus and perineum shows no abnormalities.   Rectal exam reveals normal sphincter tone without presence of hemorrhoids or masses.     Musculoskeletal: Normal range of motion.   Lymphadenopathy:     She has no axillary adenopathy.        Right: No inguinal and no supraclavicular adenopathy present.        Left: No inguinal and no supraclavicular adenopathy present.   Neurological: She is alert and oriented to person, place, and time.   Skin: Skin is warm, dry and intact.   Psychiatric: She has  a normal mood and affect. Her speech is normal and behavior is normal. Judgment and thought content normal.       ASSESSMENT and PLAN  Encounter  Diagnoses   Name Primary?   ??? Routine gynecological examination Yes   ??? Screening for malignant neoplasm of the rectum    ??? Irregular menses    ??? Perimenopause      Orders Placed This Encounter   ??? LIPID PANEL   ??? METABOLIC PANEL, COMPREHENSIVE   ??? PHOSPHORUS   ??? URIC ACID   ??? VITAMIN D, 25 HYDROXY   ??? TSH, 3RD GENERATION   ??? AMB POC URINALYSIS DIP STICK MANUAL W/ MICRO   ??? AMB POC HEMOGLOBIN (HGB)   ??? AMB POC OCCULT, OTHER SOURCE   ??? ZENCHENT, 28, 0.4-35 mg-mcg per tablet   ??? PAP (IMAGE GUIDED) W/REFL HPV ALL PATHOLOGY ([id number])     Follow-up Disposition:  Return in about 1 year (around 04/24/2014), or if symptoms worsen or fail to improve, for Annual Exam.  On Lutera now will change to Zenchent to increase estrogen component for perimenopause, mammogram done few weeks ago with mobile unit , yearly labs

## 2013-04-24 NOTE — Patient Instructions (Addendum)
MyChart Activation    Thank you for requesting access to MyChart. Please follow the instructions below to securely access and download your online medical record. MyChart allows you to send messages to your doctor, view your test results, renew your prescriptions, schedule appointments, and more.    How Do I Sign Up?    1. In your internet browser, go to [email or web address]  2. Click on the First Time User? Click Here link in the Sign In box. You will be redirect to the New Member Sign Up page.  3. Enter your MyChart Access Code exactly as it appears below. You will not need to use this code after you???ve completed the sign-up process. If you do not sign up before the expiration date, you must request a new code.    MyChart Access Code: [email or web address]  Expires: 05/06/2013  9:35 AM (This is the date your MyChart access code will expire)    4. Enter the last four digits of your Social Security Number (xxxx) and Date of Birth (mm/dd/yyyy) as indicated and click Submit. You will be taken to the next sign-up page.  5. Create a MyChart ID. This will be your MyChart login ID and cannot be changed, so think of one that is secure and easy to remember.  6. Create a MyChart password. You can change your password at any time.  7. Enter your Password Reset Question and Answer. This can be used at a later time if you forget your password.   8. Enter your e-mail address. You will receive e-mail notification when new information is available in MyChart.  9. Click Sign Up. You can now view and download portions of your medical record.  10. Click the Download Summary menu link to download a portable copy of your medical information.    Additional Information    If you have questions, please visit the Frequently Asked Questions section of the MyChart website at [email or web address] Remember, MyChart is NOT to be used for urgent needs. For medical emergencies, dial 911.      Pelvic Exam: After Your Visit  Your  Care Instructions     When your doctor examines all of your pelvic organs, it's called a pelvic exam. Two good reasons to have this kind of exam are to check for sexually transmitted infections (STIs) and to get a Pap test. A Pap test is also called a Pap smear. It checks for early changes that can lead to cancer of the cervix.  Sometimes a pelvic exam is part of a regular checkup. In this case, you can do some things to make your test results as accurate as possible.  ?? Try to schedule the exam when you don't have your period.  ?? Don't use douches, tampons, or vaginal medicines, sprays, or powders for 24 hours before your exam.  ?? Don't have sex for 24 hours before your exam.  Other times, women have this kind of exam at any time of the month. This is because they have pelvic pain, bleeding, or discharge. Or they may have another pelvic problem.  Before your exam, it's important to share some information with your doctor. For example, if you are a survivor of rape or sexual abuse, you can talk about any concerns you may have. Your doctor will also want to know if you are pregnant or use birth control. And he or she will want to hear about any problems, surgeries, or procedures you have had in your pelvic area. You will also  need to tell your doctor when your last period was.  Follow-up care is a key part of your treatment and safety. Be sure to make and go to all appointments, and call your doctor if you are having problems. It's also a good idea to know your test results and keep a list of the medicines you take.  How is a pelvic exam done?  ?? During a pelvic exam, you will:  ?? Take off your clothes below the waist. You will get a paper or cloth cover to put over the lower half of your body.  ?? Lie on your back on an exam table. Your feet will be raised above you. Stirrups will support your feet.  ?? The doctor will:  ?? Ask you to relax your knees. Your knees need to lean out, toward the walls.  ?? Check the opening  of your vagina for sores or swelling.  ?? Gently put a tool called a speculum into your vagina. It opens the vagina a little bit. You will feel some pressure. But if you are relaxed, it will not hurt. It lets your doctor see inside the vagina.  ?? Use a small brush, spatula, or swab to get a sample of cells, if you are having a Pap test or culture. The doctor then removes the speculum.  ?? Put on gloves and put one or two fingers of one hand into your vagina. The other hand goes on your lower belly. This lets your doctor feel your pelvic organs. You will probably feel some pressure. Try to stay relaxed.  ?? Put one gloved finger into your rectum and one into your vagina, if needed. This can also help check your pelvic organs.  This exam takes about 10 minutes. At the end, you will get a washcloth or tissue to clean your vaginal area. It's normal to have some discharge after this exam. You can then get dressed.  Some test results may be ready right away. But results from a culture or a Pap test may take several days or a few weeks.  Why should you have a pelvic exam?  ?? You want to have recommended screening tests. This includes a Pap test.  ?? You think you have a vaginal infection. Signs include itching, burning, or unusual discharge.  ?? You might have been exposed to a sexually transmitted infection (STI), such as chlamydia or herpes.  ?? You have vaginal bleeding that is not part of your normal menstrual period.  ?? You have pain in your belly or pelvis.  ?? You have been sexually assaulted. A pelvic exam lets your doctor collect evidence and check for STIs.  ?? You are pregnant.  ?? You are having trouble getting pregnant.  What are the risks of a pelvic exam?  There are no risks from a pelvic exam.  When should you call for help?  Watch closely for changes in your health, and be sure to contact your doctor if:  ?? You have heavy bleeding or discharge from your vagina after the exam.   Where can you learn more?   Go to  [employer].cy  Enter M421 in the search box to learn more about "Pelvic Exam: After Your Visit."   ?? 2006-2014 Healthwise, Inc[employer]. Care instructions adapted under license by [employer] (which disclaims liability or warranty for this information). This care instruction is for use with your licensed healthcare professional. If you have questions about a medical condition or this instruction, always ask  your healthcare professional. Healthwise, Incorporated disclaims any warranty or liability for your use of this information.  Content Version: 10.2.346038; Current as of: July 02, 2012              Pelvic Exam: After Your Visit  Your Care Instructions     When your doctor examines all of your pelvic organs, it's called a pelvic exam. Two good reasons to have this kind of exam are to check for sexually transmitted infections (STIs) and to get a Pap test. A Pap test is also called a Pap smear. It checks for early changes that can lead to cancer of the cervix.  Sometimes a pelvic exam is part of a regular checkup. In this case, you can do some things to make your test results as accurate as possible.  ?? Try to schedule the exam when you don't have your period.  ?? Don't use douches, tampons, or vaginal medicines, sprays, or powders for 24 hours before your exam.  ?? Don't have sex for 24 hours before your exam.  Other times, women have this kind of exam at any time of the month. This is because they have pelvic pain, bleeding, or discharge. Or they may have another pelvic problem.  Before your exam, it's important to share some information with your doctor. For example, if you are a survivor of rape or sexual abuse, you can talk about any concerns you may have. Your doctor will also want to know if you are pregnant or use birth control. And he or she will want to hear about any problems, surgeries, or procedures you have had in your pelvic area. You will also need to tell your doctor when your  last period was.  Follow-up care is a key part of your treatment and safety. Be sure to make and go to all appointments, and call your doctor if you are having problems. It's also a good idea to know your test results and keep a list of the medicines you take.  How is a pelvic exam done?  ?? During a pelvic exam, you will:  ?? Take off your clothes below the waist. You will get a paper or cloth cover to put over the lower half of your body.  ?? Lie on your back on an exam table. Your feet will be raised above you. Stirrups will support your feet.  ?? The doctor will:  ?? Ask you to relax your knees. Your knees need to lean out, toward the walls.  ?? Check the opening of your vagina for sores or swelling.  ?? Gently put a tool called a speculum into your vagina. It opens the vagina a little bit. You will feel some pressure. But if you are relaxed, it will not hurt. It lets your doctor see inside the vagina.  ?? Use a small brush, spatula, or swab to get a sample of cells, if you are having a Pap test or culture. The doctor then removes the speculum.  ?? Put on gloves and put one or two fingers of one hand into your vagina. The other hand goes on your lower belly. This lets your doctor feel your pelvic organs. You will probably feel some pressure. Try to stay relaxed.  ?? Put one gloved finger into your rectum and one into your vagina, if needed. This can also help check your pelvic organs.  This exam takes about 10 minutes. At the end, you will get a washcloth or tissue to clean  your vaginal area. It's normal to have some discharge after this exam. You can then get dressed.  Some test results may be ready right away. But results from a culture or a Pap test may take several days or a few weeks.  Why should you have a pelvic exam?  ?? You want to have recommended screening tests. This includes a Pap test.  ?? You think you have a vaginal infection. Signs include itching, burning, or unusual discharge.  ?? You might have been  exposed to a sexually transmitted infection (STI), such as chlamydia or herpes.  ?? You have vaginal bleeding that is not part of your normal menstrual period.  ?? You have pain in your belly or pelvis.  ?? You have been sexually assaulted. A pelvic exam lets your doctor collect evidence and check for STIs.  ?? You are pregnant.  ?? You are having trouble getting pregnant.  What are the risks of a pelvic exam?  There are no risks from a pelvic exam.  When should you call for help?  Watch closely for changes in your health, and be sure to contact your doctor if:  ?? You have heavy bleeding or discharge from your vagina after the exam.   Where can you learn more?   Go to [email or web address].healthwise.net/[email or web address]  Enter M421 in the search box to learn more about "Pelvic Exam: After Your Visit."   ?? 2006-2014 Healthwise, Incorporated. Care instructions adapted under license by [employer] (which disclaims liability or warranty for this information). This care instruction is for use with your licensed healthcare professional. If you have questions about a medical condition or this instruction, always ask your healthcare professional. Healthwise, Incorporated disclaims any warranty or liability for your use of this information.  Content Version: 10.2.346038; Current as of: July 02, 2012

## 2013-05-15 NOTE — Telephone Encounter (Signed)
Left message for pt to call back regarding labs.  AST 77 and ALT 133.  Per Dr Wilhemina BonitoLebel these values are high and she would like pt to have them repeated.

## 2013-05-15 NOTE — Telephone Encounter (Signed)
Pt called and told that ALT and AST elevated.  She states Dr Bridget Hartshornangler in [place] is aware and is following these numbers.

## 2013-05-26 NOTE — Telephone Encounter (Signed)
Pt left me a voicemail on 05/25/13 @ 1143...tried to call her back and got her voicemail. Left her a message to call me if she still had questions.

## 2013-06-08 ENCOUNTER — Other Ambulatory Visit: Payer: Self-pay | Admitting: Family Medicine

## 2013-06-10 ENCOUNTER — Other Ambulatory Visit: Payer: Self-pay | Admitting: Family Medicine

## 2013-06-10 ENCOUNTER — Telehealth

## 2013-06-10 MED ORDER — LEVOTHYROXINE 100 MCG TAB
100 mcg | ORAL_TABLET | Freq: Every day | ORAL | Status: AC
Start: 2013-06-10 — End: ?

## 2013-06-10 NOTE — Telephone Encounter (Signed)
pt. called stating that on her last visit she thought that Dr. Bridget Hartshornangler refilled her Synthroid 100mcg, but she does not have any refills at the pharmacy. She states that she thinks that she should not be charged for this refill

## 2013-06-10 NOTE — Telephone Encounter (Signed)
No, I did not refill her thyroid medicine - the times I saw her were about skin issues and breathing difficulties. When I first saw her she said she had normal thyroid blood work in about July 2014, and I told her that she would need it done yearly, so she would need to come in in July for refills with me. I will refill it this time w/o charging her, but if she wants me to refill it from now on she will need to see me. The alternative is Dr. Wilhemina BonitoLebel could refill it, if she is doing the blood work.

## 2013-06-10 NOTE — Telephone Encounter (Signed)
pt. brought in her labwork done by Dr. Wilhemina BonitoLebel and her TSH was 0.415

## 2013-06-10 NOTE — Telephone Encounter (Signed)
pt. notified that Dr. Bridget Hartshornangler did refill her Synthroid and that she needs this test repeated in 1 year

## 2013-07-01 MED ORDER — ESTRADIOL 0.87 GRAM/ACTUATION (0.06%) TRANSDERMAL GEL PUMP
0.87 gram/actuation | Freq: Every day | TRANSDERMAL | Status: DC
Start: 2013-07-01 — End: 2014-02-08

## 2013-07-01 NOTE — Telephone Encounter (Signed)
Patient has been on zenchent for 2 months. She has gained 10 pounds, periods are heavier and has bad migraines the week of her period. She has had no luck doing continuous pills or estrogen patch. This was verbally reviewed with  Dr. Wilhemina BonitoLebel and patient will try elestrin.

## 2013-07-07 ENCOUNTER — Other Ambulatory Visit: Payer: Self-pay | Admitting: Family Medicine

## 2013-08-10 ENCOUNTER — Other Ambulatory Visit: Payer: Self-pay | Admitting: Family Medicine

## 2013-08-10 DIAGNOSIS — E785 Hyperlipidemia, unspecified: Secondary | ICD-10-CM

## 2013-08-10 DIAGNOSIS — Z0000 Encounter for general adult medical examination without abnormal findings: Secondary | ICD-10-CM

## 2013-08-10 DIAGNOSIS — E039 Hypothyroidism, unspecified: Secondary | ICD-10-CM

## 2013-08-10 DIAGNOSIS — R748 Abnormal levels of other serum enzymes: Secondary | ICD-10-CM

## 2013-08-11 ENCOUNTER — Telehealth: Payer: Self-pay

## 2013-08-11 DIAGNOSIS — R748 Abnormal levels of other serum enzymes: Secondary | ICD-10-CM

## 2013-08-11 DIAGNOSIS — E785 Hyperlipidemia, unspecified: Secondary | ICD-10-CM

## 2013-08-11 DIAGNOSIS — E039 Hypothyroidism, unspecified: Secondary | ICD-10-CM

## 2013-08-11 DIAGNOSIS — Z0000 Encounter for general adult medical examination without abnormal findings: Secondary | ICD-10-CM

## 2013-08-11 NOTE — Telephone Encounter (Signed)
Please inform pt that 30 day supply of Levothyroxine was sent but Dr Abner GreenspanBlyth wanted her to return in 6 months (December) for labs and physical. Patient will need labs and appt for more refills. I'm putting in lab order also  Thanks

## 2013-08-11 NOTE — Telephone Encounter (Signed)
Lab order placed.

## 2013-08-12 NOTE — Telephone Encounter (Signed)
Informed patient of medication refill and she states that [facility] has [facility] and the pharmacy sent the refill to the wrong dr.

## 2013-10-08 ENCOUNTER — Other Ambulatory Visit: Payer: Self-pay | Admitting: Family Medicine

## 2013-10-08 NOTE — Telephone Encounter (Signed)
Patient states that she has moved to [place] and is seeing another doctor down there.

## 2013-10-08 NOTE — Telephone Encounter (Signed)
Please see if patient is switching to Dr B? Pt saw Dr Abner GreenspanBlyth on 10-17-12. Pt needs an appt for refills.

## 2013-12-08 NOTE — Progress Notes (Signed)
[facility]  [address] [place] [address]  [phone]      Trixie Deis  , 43 y.o.,  female   presents for  1 step PPD for employer.   12/08/2013  At 1110, a Tuberculin PPD, Mantoux Method (0.71ml PPD), is administered intracutaneously to the Left forearm.    Patient is advised that skin test results must be read between 48 and 72 hours after administration.

## 2013-12-10 LAB — AMB POC TUBERCULOSIS, INTRADERMAL (SKIN TEST)
PPD: NEGATIVE Negative
mm Induration: 0 mm

## 2014-02-08 ENCOUNTER — Ambulatory Visit
Admit: 2014-02-08 | Discharge: 2014-02-08 | Payer: BLUE CROSS/BLUE SHIELD | Attending: Family Medicine | Primary: Family Medicine

## 2014-02-08 DIAGNOSIS — J4521 Mild intermittent asthma with (acute) exacerbation: Secondary | ICD-10-CM

## 2014-02-08 MED ORDER — ALBUTEROL SULFATE HFA 90 MCG/ACTUATION AEROSOL INHALER
90 mcg/actuation | RESPIRATORY_TRACT | Status: DC | PRN
Start: 2014-02-08 — End: 2014-03-08

## 2014-02-08 MED ORDER — METHYLPREDNISOLONE 4 MG TABS IN A DOSE PACK
4 mg | PACK | ORAL | Status: DC
Start: 2014-02-08 — End: 2014-03-08

## 2014-02-08 MED ORDER — MOMETASONE 110 MCG (30 DOSES) BREATH ACTIVATED POWDER AEROSOL
110 mcg/ actuation (30) | ORAL_CAPSULE | Freq: Every day | RESPIRATORY_TRACT | Status: DC
Start: 2014-02-08 — End: 2014-03-08

## 2014-02-08 NOTE — Patient Instructions (Signed)
Using a Dry Powder Inhaler: After Your Visit  Your Care Instructions     A dry powder inhaler lets you breathe medicine into your lungs quickly. Inhaled medicine works faster than the same medicine in a pill. An inhaler lets you take less medicine than you would need if you took it as a pill.  A dry powder inhaler delivers medicine in the form of a fine powder. Dry powder inhalers are breath-activated. This means when you breathe in through the inhaler, the inhaler releases medicine into your lungs.  Dry powder inhalers come in different shapes and sizes. For some, you need to add the medicine to the inhaler each time you use it. Other dry powder inhalers come with a supply of medicine already in them. But for these, you will need to "load" each dose of medicine each time you use it. How you load a dose depends on the type of inhaler you have.  Follow-up care is a key part of your treatment and safety. Be sure to make and go to all appointments, and call your doctor if you are having problems. It's also a good idea to know your test results and keep a list of the medicines you take.  How can you care for yourself at home?  To get started  ?? Talk with your doctor, respiratory therapist, or pharmacist to be sure you are using your inhaler the right way. It might help if you practice using it in front of a mirror. Use the inhaler exactly as prescribed.  ?? Check that you have the correct medicine. If you use several inhalers, put a label on each one so that you know which one to use at the right time.  ?? Keep your inhaler in a cool, dry place. Do not store your inhaler in the bathroom. Moisture in the air can cause the dry powder to clump together. This can clog the inhaler.  ?? Keep track of how much medicine is in the inhaler. Some dry powder inhalers have dose counters that show how many doses are left. If your inhaler does not have a dose counter, your doctor or pharmacist can teach  you how to keep track of how much medicine is left.  ?? If you are using a steroid medicine in the inhaler, gargle and rinse out your mouth with water after use. Do not swallow the water. Swallowing the water will increase the chance that the medicine will get into your bloodstream. This may make it more likely that you will have side effects.  ?? Some powder may build up on the inhaler. You do not need to clean the inhaler every day. Follow your doctor's or pharmacist's instructions for cleaning your inhaler.  To use a dry powder inhaler  ?? Remove the inhaler cap, if there is one.  ?? Add or load a dose of medicine as directed by your health care provider.  ?? Tilt your head back a little, and breathe out slowly and completely. Hold the inhaler away from your mouth while you breathe out. Do not breathe out into the inhaler. This can blow some of the powder medicine out of the inhaler. The moisture in your breath also can cause the dry powder to clump together and clog the inhaler.  ?? Place the inhaler's mouthpiece in your mouth. Close your lips tightly around the mouthpiece.  ?? Inhale quickly and deeply through your mouth for 2 or 3 seconds. This pulls the powder from the inhaler into  your lungs. After you have inhaled the powder, take the inhaler out of your mouth.  ?? Hold your breath for 10 seconds. This will let the medicine settle in your lungs. Then slowly breathe out through pursed lips. Make sure not to breathe out into the inhaler.  ?? Repeat these steps if you need to take a second dose.   Where can you learn more?   Go to [email or web address]  Enter D299 in the search box to learn more about "Using a Dry Powder Inhaler: After Your Visit."   ?? 2006-2015 [employer]. Care instructions adapted under license by [employer] [place] (which disclaims liability or warranty for this information). This care instruction is for use with your licensed  healthcare professional. If you have questions about a medical condition or this instruction, always ask your healthcare professional. Healthwise, Incorporated disclaims any warranty or liability for your use of this information.  Content Version: 10.5.422740; Current as of: December 30, 2012              Using a Metered-Dose Inhaler: After Your Visit  Your Care Instructions     A metered-dose inhaler lets you breathe medicine into your lungs quickly. Inhaled medicine works faster than the same medicine in a pill. An inhaler allows you to take less medicine than you would need if you took it as a pill.  "Metered-dose" means that the inhaler gives a measured amount of medicine each time you use it. A metered-dose inhaler gives medicine in the form of a liquid mist.  Your doctor may want you to use a spacer with your inhaler. A spacer is a chamber that you attach to the inhaler. The chamber holds the medicine before you inhale it. That way, you can inhale the medicine in as many breaths as you need. Doctors recommend using a spacer with most metered-dose inhalers, especially those with corticosteroid medicines.  Follow-up care is a key part of your treatment and safety. Be sure to make and go to all appointments, and call your doctor if you are having problems. It's also a good idea to know your test results and keep a list of the medicines you take.  How can you care for yourself at home?  To get started using your inhaler  ?? Talk with your health care provider to be sure you are using your inhaler the right way. It might help if you practice using it in front of a mirror. Use the inhaler exactly as prescribed.  ?? Check that you have the correct medicine. If you use more than one inhaler, put a label on each one. This will let you know which one to use at the right time.  ?? Keep track of how much medicine is in the inhaler. Check the label to see how many doses are in the container. If you know how many puffs you  can take, you can replace the inhaler before you run out. Ask your health care provider how you can keep track of how much medicine is left.  ?? Use a spacer if you have problems pressing the inhaler and breathing in at the same time. You also may need a spacer if you are using corticosteroid medicines.  ?? If you are using a corticosteroid inhaler, gargle and rinse out your mouth with water after use. Do not swallow the water. Swallowing the water will increase the chance that the medicine will get into your bloodstream. This may make it more likely that you will  have side effects.  To use a spacer with an inhaler  ?? Shake the inhaler. Remove the inhaler cap, and place the mouthpiece of the inhaler into the spacer. Check the inhaler instructions to see if you need to prime your inhaler before you use it. If it needs priming, follow the instructions on how to prime your inhaler.  ?? Remove the cap from the spacer.  ?? Hold the inhaler upright with the mouthpiece at the bottom.  ?? Tilt your head back a little, and breathe out slowly and completely.  ?? Place the spacer's mouthpiece in your mouth.  ?? Press down on the inhaler to spray one puff of medicine into the spacer, and then start breathing in slowly. Wait to inhale until after you have pressed down on the inhaler.  ?? Hold your breath for 10 seconds. This will let the medicine settle in your lungs.  ?? If you need to take a second dose, wait 30 to 60 seconds to allow the inhaler valve to refill.  To use an inhaler without a spacer  ?? Shake the inhaler as directed. Remove the cap. Check the instructions to see if you need to prime your inhaler before you use it. If it needs priming, follow the instructions on how to prime your inhaler.  ?? Hold the inhaler upright with the mouthpiece at the bottom.  ?? Tilt your head back a little, and breathe out slowly and completely.  ?? Position the inhaler in one of two ways:   ?? You can place the inhaler 1 to 2 inches in front of your open mouth, without closing your lips over it. Try to open your mouth as wide as you can. Placing the inhaler in front of your open mouth may be better for getting the medicine into your lungs. But some people may find this too hard to do.  ?? Or you can place the inhaler in your mouth. This is easier for most people. And it lowers the risk that any of the medicine will get into your eyes.  ?? Start taking slow, even breaths through your mouth. Press down on the inhaler once, then inhale fully.  ?? Hold your breath for 10 seconds. This will let the medicine settle in your lungs.  ?? If you need to take a second dose, wait 30 to 60 seconds to allow the inhaler valve to refill.   Where can you learn more?   Go to [email or web address].[email or web address].[email or web address]  Enter K111 in the search box to learn more about "Using a Metered-Dose Inhaler: After Your Visit."   ?? 2006-2015 Health[employer], [employer]. Care instructions adapted under license by [employer] [place] (which disclaims liability or warranty for this information). This care instruction is for use with your licensed healthcare professional. If you have questions about a medical condition or this instruction, always ask your healthcare professional. Health[employer], Inc[employer] disclaims any warranty or liability for your use of this information.  Content Version: 10.5.422740; Current as of: December 30, 2012

## 2014-02-08 NOTE — Progress Notes (Signed)
[facility]  [address]  [place], [place] [address]  Phone: [phone] Fax [phone]  Wyvonne Lenz, MD  02/08/2014         Ms. Sullivant is a 43 y.o. female patient who comes in for sneezing, post nasal drip, chest congestion, wheezing, dry cough, productive cough, clear nasal discharge, hoarseness and productive cough .It has been going on for 14 days. It is getting worse. Has tried over the counter medicines without success. Has used her albuterol inhaler, but it does not seem to be helping the past several days. Does not remember being told she had asthma, but does recall about 2-3 years ago going for breathing tests and has a device at home that shows her where her breathing should be (she blows into it) and it says it is 300 - should be 420 according to what she was told there. No fever. Chest feels tight.She did have trouble with this last fall and did take an Asmanex inhaler and albuterol, and did get better. This is the first time she has had to use her inhaler since last fall. This time of year always seems to bother her.No family history of asthma.    Ms. Karnik  has a past medical history of Headache; Abnormal Pap smear; Calculus of kidney; and Thyroid disease.    Ms. Catala  has past surgical history that includes cholecystectomy; tonsillectomy; orthopaedic (2011); and orthopaedic (1999).    Ms. Sharpe  has a current medication list which includes the following prescription(s): albuterol, mometasone, methylprednisolone, levothyroxine, zenchent (28), cetirizine, and rizatriptan.  Current Outpatient Prescriptions   Medication Sig Dispense Refill   ??? albuterol (PROAIR HFA) 90 mcg/actuation inhaler Take 2 Puffs by inhalation every four (4) hours as needed for Wheezing. 1 Inhaler 1   ??? mometasone (ASMANEX TWISTHALER) 110 mcg (30 doses) aepb inhaler Take 1 Puff by inhalation daily. 30 Cap 5   ??? methylPREDNISolone (MEDROL, PAK,) 4 mg tablet Per dose pack instructions  1 Package 0   ??? levothyroxine (SYNTHROID) 100 mcg tablet Take 1 Tab by mouth Daily (before breakfast). 90 Tab 3   ??? ZENCHENT, 28, 0.4-35 mg-mcg per tablet Take 1 tablet by mouth daily. 1 Package 11   ??? cetirizine (ZYRTEC) 10 mg tablet Take  by mouth daily.     ??? rizatriptan (MAXALT-MLT) 10 mg disintegrating tablet Take 10 mg by mouth once as needed for Migraine.       Family History   Problem Relation Age of Onset   ??? Hypertension Mother    ??? Thyroid Disease Mother    ??? Elevated Lipids Mother    ??? Gall Bladder Disease Mother    ??? Migraines Mother    ??? Diabetes Father    ??? Diabetes Maternal Grandmother    ??? Cancer Maternal Grandmother 24     lung cancer - non smoker   ??? Lung Disease Maternal Grandmother    ??? Diabetes Paternal Grandmother    ??? Diabetes Paternal Grandfather    ??? Cancer Maternal Grandfather      cirrhosis     History     Social History   ??? Marital Status: MARRIED     Spouse Name: N/A     Number of Children: N/A   ??? Years of Education: N/A     Occupational History   ??? Not on file.     Social History Main Topics   ??? Smoking status: Never Smoker    ???  Smokeless tobacco: Never Used   ??? Alcohol Use: No   ??? Drug Use: No   ??? Sexual Activity:     Partners: Male     Birth Control/ Protection: Pill     Other Topics Concern   ??? Not on file     Social History Narrative        Ms. Oka allergies:   Allergies   Allergen Reactions   ??? Codeine Nausea and Vomiting       BP 140/80 mmHg   Pulse 84   Temp(Src) 98.3 ??F (36.8 ??C) (Oral)   Resp 20   Ht 5\' 1"  (1.549 m)   Wt 193 lb (87.544 kg)   BMI 36.49 kg/m2   LMP 02/03/2014 (Exact Date)    HEENT: Normocephalic, atraumatic, pupils equal and reactive to light and accommodation, tympanic membranes intact without erythema or edema, oropharynx clear with no erythema or tonsillar exudate  Neck :supple, no masses, no thyromegaly, no lymphadenopathy  Lungs: clear to auscultation bilaterally  CV: regular rate and rhythm, without murmurs, rubs, or gallops           Tresa EndoKelly was seen today for cough and hoarse.    Diagnoses and associated orders for this visit:    Asthmatic bronchitis, mild intermittent, with acute exacerbation  - albuterol (PROAIR HFA) 90 mcg/actuation inhaler; Take 2 Puffs by inhalation every four (4) hours as needed for Wheezing.  - mometasone (ASMANEX TWISTHALER) 110 mcg (30 doses) aepb inhaler; Take 1 Puff by inhalation daily.  - methylPREDNISolone (MEDROL, PAK,) 4 mg tablet; Per dose pack instructions    Wheezing  - albuterol (PROAIR HFA) 90 mcg/actuation inhaler; Take 2 Puffs by inhalation every four (4) hours as needed for Wheezing.    Cough  - albuterol (PROAIR HFA) 90 mcg/actuation inhaler; Take 2 Puffs by inhalation every four (4) hours as needed for Wheezing.    Upper respiratory infection  - albuterol (PROAIR HFA) 90 mcg/actuation inhaler; Take 2 Puffs by inhalation every four (4) hours as needed for Wheezing.        Her other chronic conditions are stable at this time.  She is stable on the current treatment and tolerating it well.  No significant sides effects.  Will not adjust therapy at this time, unless noted above.   We will continue to monitor for any problems.  Medications refilled and lab work has been ordered where needed.  New medications are explained including any potential interactions or side effects.  The patient's questions were answered.  She  understands the above and has no further questions.    Further workup and treatment should be done if symptoms persist, worsen or new symptoms occur. The patient will call to notify us of any problems, complications or worsening symptoms.    Follow-up Disposition:  Return in about 4 weeks (around 03/08/2014).    Patient Instructions       Using a Dry Powder Inhaler: After Your Visit  Your Care Instructions     A dry powder inhaler lets you breathe medicine into your lungs quickly. Inhaled medicine works faster than the same medicine in a pill. An inhaler  lets you take less medicine than you would need if you took it as a pill.  A dry powder inhaler delivers medicine in the form of a fine powder. Dry powder inhalers are breath-activated. This means when you breathe in through the inhaler, the inhaler releases medicine into your lungs.  Dry powder inhalers come in different shapes  and sizes. For some, you need to add the medicine to the inhaler each time you use it. Other dry powder inhalers come with a supply of medicine already in them. But for these, you will need to "load" each dose of medicine each time you use it. How you load a dose depends on the type of inhaler you have.  Follow-up care is a key part of your treatment and safety. Be sure to make and go to all appointments, and call your doctor if you are having problems. It's also a good idea to know your test results and keep a list of the medicines you take.  How can you care for yourself at home?  To get started  ?? Talk with your doctor, respiratory therapist, or pharmacist to be sure you are using your inhaler the right way. It might help if you practice using it in front of a mirror. Use the inhaler exactly as prescribed.  ?? Check that you have the correct medicine. If you use several inhalers, put a label on each one so that you know which one to use at the right time.  ?? Keep your inhaler in a cool, dry place. Do not store your inhaler in the bathroom. Moisture in the air can cause the dry powder to clump together. This can clog the inhaler.  ?? Keep track of how much medicine is in the inhaler. Some dry powder inhalers have dose counters that show how many doses are left. If your inhaler does not have a dose counter, your doctor or pharmacist can teach you how to keep track of how much medicine is left.  ?? If you are using a steroid medicine in the inhaler, gargle and rinse out your mouth with water after use. Do not swallow the water. Swallowing the  water will increase the chance that the medicine will get into your bloodstream. This may make it more likely that you will have side effects.  ?? Some powder may build up on the inhaler. You do not need to clean the inhaler every day. Follow your doctor's or pharmacist's instructions for cleaning your inhaler.  To use a dry powder inhaler  ?? Remove the inhaler cap, if there is one.  ?? Add or load a dose of medicine as directed by your health care provider.  ?? Tilt your head back a little, and breathe out slowly and completely. Hold the inhaler away from your mouth while you breathe out. Do not breathe out into the inhaler. This can blow some of the powder medicine out of the inhaler. The moisture in your breath also can cause the dry powder to clump together and clog the inhaler.  ?? Place the inhaler's mouthpiece in your mouth. Close your lips tightly around the mouthpiece.  ?? Inhale quickly and deeply through your mouth for 2 or 3 seconds. This pulls the powder from the inhaler into your lungs. After you have inhaled the powder, take the inhaler out of your mouth.  ?? Hold your breath for 10 seconds. This will let the medicine settle in your lungs. Then slowly breathe out through pursed lips. Make sure not to breathe out into the inhaler.  ?? Repeat these steps if you need to take a second dose.   Where can you learn more?   Go to [employer]  Enter D299 in the search box to learn more about "Using a Dry Powder Inhaler: After Your Visit."   ?? 2006-2015 [employer]. Care instructions  adapted under license by [employer] (which disclaims liability or warranty for this information). This care instruction is for use with your licensed healthcare professional. If you have questions about a medical condition or this instruction, always ask your healthcare professional. [employer], [employer] disclaims any warranty or liability for your use of this information.   Content Version: 10.5.422740; Current as of: December 30, 2012              Using a Metered-Dose Inhaler: After Your Visit  Your Care Instructions     A metered-dose inhaler lets you breathe medicine into your lungs quickly. Inhaled medicine works faster than the same medicine in a pill. An inhaler allows you to take less medicine than you would need if you took it as a pill.  "Metered-dose" means that the inhaler gives a measured amount of medicine each time you use it. A metered-dose inhaler gives medicine in the form of a liquid mist.  Your doctor may want you to use a spacer with your inhaler. A spacer is a chamber that you attach to the inhaler. The chamber holds the medicine before you inhale it. That way, you can inhale the medicine in as many breaths as you need. Doctors recommend using a spacer with most metered-dose inhalers, especially those with corticosteroid medicines.  Follow-up care is a key part of your treatment and safety. Be sure to make and go to all appointments, and call your doctor if you are having problems. It's also a good idea to know your test results and keep a list of the medicines you take.  How can you care for yourself at home?  To get started using your inhaler  ?? Talk with your health care provider to be sure you are using your inhaler the right way. It might help if you practice using it in front of a mirror. Use the inhaler exactly as prescribed.  ?? Check that you have the correct medicine. If you use more than one inhaler, put a label on each one. This will let you know which one to use at the right time.  ?? Keep track of how much medicine is in the inhaler. Check the label to see how many doses are in the container. If you know how many puffs you can take, you can replace the inhaler before you run out. Ask your health care provider how you can keep track of how much medicine is left.  ?? Use a spacer if you have problems pressing the inhaler and breathing in  at the same time. You also may need a spacer if you are using corticosteroid medicines.  ?? If you are using a corticosteroid inhaler, gargle and rinse out your mouth with water after use. Do not swallow the water. Swallowing the water will increase the chance that the medicine will get into your bloodstream. This may make it more likely that you will have side effects.  To use a spacer with an inhaler  ?? Shake the inhaler. Remove the inhaler cap, and place the mouthpiece of the inhaler into the spacer. Check the inhaler instructions to see if you need to prime your inhaler before you use it. If it needs priming, follow the instructions on how to prime your inhaler.  ?? Remove the cap from the spacer.  ?? Hold the inhaler upright with the mouthpiece at the bottom.  ?? Tilt your head back a little, and breathe out slowly and completely.  ?? Place the spacer's mouthpiece  in your mouth.  ?? Press down on the inhaler to spray one puff of medicine into the spacer, and then start breathing in slowly. Wait to inhale until after you have pressed down on the inhaler.  ?? Hold your breath for 10 seconds. This will let the medicine settle in your lungs.  ?? If you need to take a second dose, wait 30 to 60 seconds to allow the inhaler valve to refill.  To use an inhaler without a spacer  ?? Shake the inhaler as directed. Remove the cap. Check the instructions to see if you need to prime your inhaler before you use it. If it needs priming, follow the instructions on how to prime your inhaler.  ?? Hold the inhaler upright with the mouthpiece at the bottom.  ?? Tilt your head back a little, and breathe out slowly and completely.  ?? Position the inhaler in one of two ways:  ?? You can place the inhaler 1 to 2 inches in front of your open mouth, without closing your lips over it. Try to open your mouth as wide as you can. Placing the inhaler in front of your open mouth may be better for  getting the medicine into your lungs. But some people may find this too hard to do.  ?? Or you can place the inhaler in your mouth. This is easier for most people. And it lowers the risk that any of the medicine will get into your eyes.  ?? Start taking slow, even breaths through your mouth. Press down on the inhaler once, then inhale fully.  ?? Hold your breath for 10 seconds. This will let the medicine settle in your lungs.  ?? If you need to take a second dose, wait 30 to 60 seconds to allow the inhaler valve to refill.   Where can you learn more?   Go to [email or web address]huhttp://www.healthwise.net/[email or web address]  Enter K111 in the search box to learn more about "Using a Metered-Dose Inhaler: After Your Visit."   ?? 2006-2015 [employer]. Care instructions adapted under license by [employer] (which disclaims liability or warranty for this information). This care instruction is for use with your licensed healthcare professional. If you have questions about a medical condition or this instruction, always ask your healthcare professional. [employer], [employer] disclaims any warranty or liability for your use of this information.  Content Version: 10.5.422740; Current as of: December 30, 2012                  Louie BunJULIE M Rayley Gao, MD  [facility] [place]  [address]  [place] [id number]  Phone [phone] ??? [phone]       Louie BunJULIE M Nikola Blackston, MD

## 2014-02-22 ENCOUNTER — Institutional Professional Consult (permissible substitution): Admit: 2014-02-22 | Discharge: 2014-02-22 | Payer: BLUE CROSS/BLUE SHIELD | Primary: Family Medicine

## 2014-02-22 DIAGNOSIS — Z23 Encounter for immunization: Secondary | ICD-10-CM

## 2014-03-08 ENCOUNTER — Ambulatory Visit
Admit: 2014-03-08 | Discharge: 2014-03-08 | Payer: BLUE CROSS/BLUE SHIELD | Attending: Family Medicine | Primary: Family Medicine

## 2014-03-08 DIAGNOSIS — J4520 Mild intermittent asthma, uncomplicated: Secondary | ICD-10-CM

## 2014-03-08 MED ORDER — MOMETASONE 110 MCG (30 DOSES) BREATH ACTIVATED POWDER AEROSOL
110 mcg/ actuation (30) | ORAL_CAPSULE | Freq: Every day | RESPIRATORY_TRACT | Status: DC
Start: 2014-03-08 — End: 2014-06-17

## 2014-03-08 MED ORDER — ALBUTEROL SULFATE HFA 90 MCG/ACTUATION AEROSOL INHALER
90 mcg/actuation | RESPIRATORY_TRACT | Status: DC | PRN
Start: 2014-03-08 — End: 2015-11-02

## 2014-03-08 NOTE — Progress Notes (Signed)
[facility] [place]  [address]  [place], [place] [address]  Phone: [phone] Fax [phone]  Wyvonne LenzJulie Gable Odonohue, MD  03/08/2014           Sally [name]  is a 43 y.o.  year old  female patient who comes in to follow up on asthma. She was given a ProAir inhaler, an Asmanex inhaler, and a medrol dosepak one month ago. She says it all really helped, and then about a week ago she stopped the Asmanex, as she wanted to see if it was really helping. She has noted that her cough has gotten much more prominent off the Asmanex; in fact, her husband asked if she had been using her inhalers or not, as he noticed she was coughing a lot again. Cough is non productive, and she does not have head congestion or nasal secretions.     When she came in a month ago, she Had used her albuterol inhaler, but it did not seem to be helping.. Does not remember being told she had asthma, but does recall about 2-3 years ago going for breathing tests and has a device at home that shows her where her breathing should be (she blows into it) and it says it is 300 - should be 420 according to what she was told there. No fever. Chest feels tight.She did have trouble with this last fall and did take an Asmanex inhaler and albuterol, and did get better. This is the first time she has had to use her inhaler since last fall. This time of year always seems to bother her.No family history of asthma.         Sally [name]  has  has a past medical history of Headache; Abnormal Pap smear; Calculus of kidney; and Thyroid disease.    Sally [name]  has  has past surgical history that includes cholecystectomy; tonsillectomy; orthopaedic (2011); and orthopaedic (1999).    [name] has a current medication list which includes the following prescription(s): mometasone, albuterol, levothyroxine, zenchent (28), cetirizine, and rizatriptan.  Current Outpatient Prescriptions   Medication Sig Dispense Refill    ??? mometasone (ASMANEX TWISTHALER) 110 mcg (30 doses) aepb inhaler Take 1 Puff by inhalation daily. 30 Cap 5   ??? albuterol (PROAIR HFA) 90 mcg/actuation inhaler Take 2 Puffs by inhalation every four (4) hours as needed for Wheezing. 1 Inhaler 1   ??? levothyroxine (SYNTHROID) 100 mcg tablet Take 1 Tab by mouth Daily (before breakfast). 90 Tab 3   ??? ZENCHENT, 28, 0.4-35 mg-mcg per tablet Take 1 tablet by mouth daily. 1 Package 11   ??? cetirizine (ZYRTEC) 10 mg tablet Take  by mouth daily.     ??? rizatriptan (MAXALT-MLT) 10 mg disintegrating tablet Take 10 mg by mouth once as needed for Migraine.         Sally [name]  has the following allergies:   Allergies   Allergen Reactions   ??? Codeine Nausea and Vomiting       BP 118/70 mmHg   Pulse 80   Temp(Src) 98.3 ??F (36.8 ??C) (Oral)   Resp 20   Ht 5\' 1"  (1.549 m)   Wt 191 lb (86.637 kg)   BMI 36.11 kg/m2   LMP 01/27/2014 (Exact Date)    HEENT: Normocephalic, atraumatic, pupils equal and reactive to light. Tympanic membranes intact without erythema or edema. Oropharynx clear.   Neck: Supple, no masses or thyromegaly.  Lungs: clear to auscultation bilaterally.  CV:  regular rate and rhythm, without murmurs, rubs, or gallops  Abdomen: soft, nontender, nondistended, no masses. No CVAT tenderness.  Ext: No lower extremity edema.    Coughing repeatedly while in the office with me, but no wheezing heard today.    Sally [name] was seen today for follow-up.    Diagnoses and all orders for this visit:    Asthma, mild intermittent, uncomplicated  Orders:  -     REFERRAL TO PULMONARY DISEASE  -     mometasone (ASMANEX TWISTHALER) 110 mcg (30 doses) aepb inhaler; Take 1 Puff by inhalation daily.  -     albuterol (PROAIR HFA) 90 mcg/actuation inhaler; Take 2 Puffs by inhalation every four (4) hours as needed for Wheezing.    Wheezing  Orders:  -     albuterol (PROAIR HFA) 90 mcg/actuation inhaler; Take 2 Puffs by inhalation every four (4) hours as needed for Wheezing.    Cough  Orders:   -     albuterol (PROAIR HFA) 90 mcg/actuation inhaler; Take 2 Puffs by inhalation every four (4) hours as needed for Wheezing.    Her other chronic conditions are stable at this time.  She is stable on the current treatment and tolerating it well.  No significant sides effects.  Will not adjust therapy at this time, unless noted above.   We will continue to monitor for any problems.  Medications refilled and lab work has been ordered where needed.  New medications are explained including any potential interactions or side effects.  The patient's questions were answered.  She  understands the above and has no further questions.    She is to get back on the Asmanex as it took her cough and sob away completely. refrer to [facility] for PFT's and further opinion regarding this Was not told she had asthma 3 years ago when she had a workup done, so she is curious about the diagnosis.      Further workup and treatment should be done if symptoms persist, worsen or new symptoms occur. The patient will call to notify us of any problems, complications or worsening symptoms.    Follow-up Disposition: Not on File    Patient Instructions       Asthma in Adults: After Your Visit  Your Care Instructions     During an asthma attack, your airways swell and narrow as a reaction to certain things (triggers). This makes it hard to breathe.  You may be able to prevent asthma attacks if you avoid the things that set off your asthma symptoms. Keeping your asthma under control and treating symptoms before they get bad can help you avoid severe attacks.  If you can control your asthma, you may be able to do all of your normal daily activities. You may also avoid asthma attacks and trips to the hospital.  Follow-up care is a key part of your treatment and safety. Be sure to make and go to all appointments, and call your doctor if you are having problems. It's also a good idea to know your test results and keep a list of the medicines you take.   How can you care for yourself at home?  ?? Follow your asthma action plan so you can manage your symptoms at home. An asthma action plan will help you prevent and control airway reactions and will tell you what to do during an asthma attack. If you do not have an asthma action plan, work with your doctor to build one.  ??  Take your asthma medicine exactly as prescribed. Medicine plays an important role in controlling asthma. Talk to your doctor right away if you have any questions about what to take and how to take it.  ?? Use your quick-relief medicine when you have symptoms of an attack. Quick-relief medicine often is an albuterol inhaler. Some people need to use quick-relief medicine before they exercise.  ?? Take your controller medicine every day, not just when you have symptoms. Controller medicine is usually an inhaled corticosteroid. The goal is to prevent problems before they occur. Do not use your controller medicine to try to treat an attack that has already started. It does not work fast enough to help.  ?? If your doctor prescribed corticosteroid pills to use during an attack, take them as directed. They may take hours to work, but they may shorten the attack and help you breathe better.  ?? Keep your quick-relief medicine with you at all times.  ?? Talk to your doctor before using other medicines. Some medicines, such as aspirin, can cause asthma attacks in some people.  ?? If you have a peak flow meter, use it to check how well you are breathing. This can help you predict when an asthma attack is going to occur. Then you can take medicine to prevent the asthma attack or make it less severe.  ?? See your doctor regularly. These visits will help you learn more about asthma and what you can do to control it. Your doctor will monitor your treatment to make sure the medicine is helping you.  ?? Keep track of your asthma attacks and your treatment. After you have had  an attack, write down what triggered it, what helped end it, and any concerns you have about your asthma action plan. Take your diary when you see your doctor. You can then review your asthma action plan and decide if it is working.  ?? Do not smoke or allow others to smoke around you. Avoid smoky places. Smoking makes asthma worse. If you need help quitting, talk to your doctor about stop-smoking programs and medicines. These can increase your chances of quitting for good.  ?? Learn what triggers an asthma attack for you, and avoid the triggers when you can. Common triggers include colds, smoke, air pollution, dust, pollen, mold, pets, cockroaches, stress, and cold air.  ?? Avoid colds and the flu. Get a pneumococcal vaccine shot. If you have had one before, ask your doctor whether you need a second dose. Get a flu vaccine every fall. If you must be around people with colds or the flu, wash your hands often.  When should you call for help?  Call 911 anytime you think you may need emergency care. For example, call if:  ?? You have severe trouble breathing.  Call your doctor now or seek immediate medical care if:  ?? Your symptoms do not get better after you have followed your asthma action plan.  ?? You cough up yellow, dark brown, or bloody mucus (sputum).  Watch closely for changes in your health, and be sure to contact your doctor if:  ?? Your coughing and wheezing get worse.  ?? You need to use quick-relief medicine on more than 2 days a week (unless it is just for exercise).  ?? You need help figuring out what is triggering your asthma attacks.   Where can you learn more?   Go to [employer]  Enter P597 in the search box to learn more about "  Asthma in Adults: After Your Visit."   ?? 2006-2015 Healthwise, Incorporated. Care instructions adapted under license by [employer] (which disclaims liability or warranty for this information). This care instruction is for use with your licensed  healthcare professional. If you have questions about a medical condition or this instruction, always ask your healthcare professional. Healthwise, Incorporated disclaims any warranty or liability for your use of this information.  Content Version: 10.5.422740; Current as of: December 30, 2012                    Louie BunJULIE M Cooper Stamp, MD  [facility] [place]  [address]  [place] [id number]  Phone [phone] ??? [phone]         Louie BunJULIE M Niccolo Burggraf, MD

## 2014-03-08 NOTE — Patient Instructions (Signed)
Asthma in Adults: After Your Visit  Your Care Instructions     During an asthma attack, your airways swell and narrow as a reaction to certain things (triggers). This makes it hard to breathe.  You may be able to prevent asthma attacks if you avoid the things that set off your asthma symptoms. Keeping your asthma under control and treating symptoms before they get bad can help you avoid severe attacks.  If you can control your asthma, you may be able to do all of your normal daily activities. You may also avoid asthma attacks and trips to the hospital.  Follow-up care is a key part of your treatment and safety. Be sure to make and go to all appointments, and call your doctor if you are having problems. It's also a good idea to know your test results and keep a list of the medicines you take.  How can you care for yourself at home?  ?? Follow your asthma action plan so you can manage your symptoms at home. An asthma action plan will help you prevent and control airway reactions and will tell you what to do during an asthma attack. If you do not have an asthma action plan, work with your doctor to build one.  ?? Take your asthma medicine exactly as prescribed. Medicine plays an important role in controlling asthma. Talk to your doctor right away if you have any questions about what to take and how to take it.  ?? Use your quick-relief medicine when you have symptoms of an attack. Quick-relief medicine often is an albuterol inhaler. Some people need to use quick-relief medicine before they exercise.  ?? Take your controller medicine every day, not just when you have symptoms. Controller medicine is usually an inhaled corticosteroid. The goal is to prevent problems before they occur. Do not use your controller medicine to try to treat an attack that has already started. It does not work fast enough to help.  ?? If your doctor prescribed corticosteroid pills to use during an attack,  take them as directed. They may take hours to work, but they may shorten the attack and help you breathe better.  ?? Keep your quick-relief medicine with you at all times.  ?? Talk to your doctor before using other medicines. Some medicines, such as aspirin, can cause asthma attacks in some people.  ?? If you have a peak flow meter, use it to check how well you are breathing. This can help you predict when an asthma attack is going to occur. Then you can take medicine to prevent the asthma attack or make it less severe.  ?? See your doctor regularly. These visits will help you learn more about asthma and what you can do to control it. Your doctor will monitor your treatment to make sure the medicine is helping you.  ?? Keep track of your asthma attacks and your treatment. After you have had an attack, write down what triggered it, what helped end it, and any concerns you have about your asthma action plan. Take your diary when you see your doctor. You can then review your asthma action plan and decide if it is working.  ?? Do not smoke or allow others to smoke around you. Avoid smoky places. Smoking makes asthma worse. If you need help quitting, talk to your doctor about stop-smoking programs and medicines. These can increase your chances of quitting for good.  ?? Learn what triggers an asthma attack for you, and avoid   the triggers when you can. Common triggers include colds, smoke, air pollution, dust, pollen, mold, pets, cockroaches, stress, and cold air.  ?? Avoid colds and the flu. Get a pneumococcal vaccine shot. If you have had one before, ask your doctor whether you need a second dose. Get a flu vaccine every fall. If you must be around people with colds or the flu, wash your hands often.  When should you call for help?  Call 911 anytime you think you may need emergency care. For example, call if:  ?? You have severe trouble breathing.  Call your doctor now or seek immediate medical care if:   ?? Your symptoms do not get better after you have followed your asthma action plan.  ?? You cough up yellow, dark brown, or bloody mucus (sputum).  Watch closely for changes in your health, and be sure to contact your doctor if:  ?? Your coughing and wheezing get worse.  ?? You need to use quick-relief medicine on more than 2 days a week (unless it is just for exercise).  ?? You need help figuring out what is triggering your asthma attacks.   Where can you learn more?   Go to http://www.healthwise.net/[facility]  Enter P597 in the search box to learn more about "Asthma in Adults: After Your Visit."   ?? 2006-2015 Healthwise, Incorporated. Care instructions adapted under license by [employer] (which disclaims liability or warranty for this information). This care instruction is for use with your licensed healthcare professional. If you have questions about a medical condition or this instruction, always ask your healthcare professional. Healthwise, Incorporated disclaims any warranty or liability for your use of this information.  Content Version: 10.5.422740; Current as of: December 30, 2012

## 2014-03-10 ENCOUNTER — Encounter

## 2014-03-22 MED ORDER — ZENCHENT (28) 0.4 MG-35 MCG TABLET
PACK | ORAL | Status: DC
Start: 2014-03-22 — End: 2014-05-05

## 2014-03-22 NOTE — Telephone Encounter (Signed)
Refill granted until her annual.

## 2014-03-24 ENCOUNTER — Encounter

## 2014-03-25 ENCOUNTER — Inpatient Hospital Stay: Admit: 2014-03-25 | Payer: BLUE CROSS/BLUE SHIELD | Primary: Family Medicine

## 2014-03-25 ENCOUNTER — Ambulatory Visit
Admit: 2014-03-25 | Discharge: 2014-03-25 | Payer: BLUE CROSS/BLUE SHIELD | Attending: Internal Medicine | Primary: Family Medicine

## 2014-03-25 DIAGNOSIS — R059 Cough, unspecified: Secondary | ICD-10-CM

## 2014-03-25 DIAGNOSIS — J45909 Unspecified asthma, uncomplicated: Secondary | ICD-10-CM

## 2014-03-25 LAB — AMB POC SPIROMETRY

## 2014-03-25 NOTE — Progress Notes (Signed)
Sally [name]: New Patient Office Visit Note  [address]  [name], [place] [id number]  [phone]    Patient Name:  Sally DeisKelly Hambly    Date of Birth:  1971-03-31            Date of Service:  03/25/2014    Chief Complaint   Patient presents with   ??? New Patient   ??? Cough       History of Present Illness:    She is a very pleasant 43 year old female whom we are asked to evaluate in pulmonary consultation by Dr. Bridget Hartshornangler secondary to chronic cough.  She states that she has had recurrent cough during the fall and treated for bronchitis for a number of years.  She typically requires antibiotics, steroids, inhalers, and occasional nebulizer treatments in the office.  Her cough resolves after a few months.    She had allergy evaluation years ago, reports allergies to trees, pollen, mold and cat.  She received allergy shots and was on singulair for a period of time but did not perceive any definite benefit.  She was on Advair during seasonal symptoms and had good response to it.  More recently, she was begun on Asmanex, one inhalation once daily.  She had improvement but not resolution in her cough while on Asmanex.  She has occasional wheezing.  Denies shortness of breath.  Cough has been nonproductive.  She denies any definite nasal congestion/drainage, denies reflux. She has taken Zyrtec on a daily basis for a number of years, had cat Allergy, but no longer has a cat.    She was given a peak flow meter several years ago by the allergist.  States that her personal best was 420 but she is actually around 350 now.      She has used albuterol inhaler on average of once per week and notes improvement in coughing episodes with it.    SUBJECTIVE:     Review of Systems:     Complete review of systems was reviewed with the patient.  She denies fever, chills, night sweats.  She has occasional wheezing.  + Daily, nonproductive cough.  She denies definite sinus drainage/congestion.   Clears her throat intermittently.  Her seasonal allergy symptoms, rarely coughs.  She has thyroid disorder and He is on Synthroid.    Allergies   Allergen Reactions   ??? Codeine Nausea and Vomiting      Immunization History   Administered Date(s) Administered   ??? Influenza Vaccine 02/05/2013, 02/22/2014   ??? TB Skin Test (PPD) Intradermal 12/08/2013      Current Outpatient Prescriptions   Medication Sig Dispense Refill   ??? multivitamin, tx-iron-ca-min (THERA-M W/ IRON) 9 mg iron-400 mcg tab tablet Take 1 Tab by mouth daily.     ??? ZENCHENT, 28, 0.4-35 mg-mcg per tablet TAKE 1 TABLET BY MOUTH EVERY DAY 1 Package 2   ??? mometasone (ASMANEX TWISTHALER) 110 mcg (30 doses) aepb inhaler Take 1 Puff by inhalation daily. 30 Cap 5   ??? albuterol (PROAIR HFA) 90 mcg/actuation inhaler Take 2 Puffs by inhalation every four (4) hours as needed for Wheezing. 1 Inhaler 1   ??? levothyroxine (SYNTHROID) 100 mcg tablet Take 1 Tab by mouth Daily (before breakfast). 90 Tab 3   ??? cetirizine (ZYRTEC) 10 mg tablet Take  by mouth daily.     ??? rizatriptan (MAXALT-MLT) 10 mg disintegrating tablet Take 10 mg by mouth once as needed for Migraine.  Past Medical History   Diagnosis Date   ??? Headache    ??? Abnormal Pap smear    ??? Calculus of kidney    ??? Thyroid disease       Past Surgical History   Procedure Laterality Date   ??? Hx cholecystectomy     ??? Hx tonsillectomy     ??? Hx orthopaedic  2011     left plantar fasciitis surgery   ??? Hx orthopaedic  1999     left 3rd finger cyst removed      Family History   Problem Relation Age of Onset   ??? Hypertension Mother    ??? Thyroid Disease Mother    ??? Elevated Lipids Mother    ??? Gall Bladder Disease Mother    ??? Migraines Mother    ??? Diabetes Father    ??? Diabetes Maternal Grandmother    ??? Cancer Maternal Grandmother [id number]     lung cancer - non smoker   ??? Lung Disease Maternal Grandmother    ??? Diabetes Paternal Grandmother    ??? Diabetes Paternal Grandfather    ??? Cancer Maternal Grandfather      cirrhosis       History   Substance Use Topics   ??? Smoking status: Never Smoker    ??? Smokeless tobacco: Never Used   ??? Alcohol Use: No       OBJECTIVE:    Physical Exam:  VITALS    BP 132/88 mmHg   Pulse 89   Temp(Src) 97.7 ??F (36.5 ??C) (Oral)   Resp 16   Ht 5' 2.5" (1.588 m)   Wt 193 lb 3.2 oz (87.635 kg)   BMI 34.75 kg/m2   SpO2 99%   LMP 01/27/2014 (Exact Date)    Patient Active Problem List   Diagnosis Code   ??? Unspecified hypothyroidism E03.9   ??? Migraine G43.909   ??? Allergic rhinitis due to pollen J30.1   ??? Asthma J45.909   ??? Screening for malignant neoplasm of the rectum Z12.12   ??? Routine gynecological examination Z01.419   ??? Irregular menses N92.6   ??? Cough R05       GENERAL APPEARANCE:      The patient is overweight, well groomed and in no respiratory distress.    HEENT:         PERRL.  Conjunctivae unremarkable.  Nasal mucosa is without epistaxis, exudate, or polyps.  Gums and dentition are unremarkable.  There is no oropharyngeal narrowing.  There is clear drainage present in the posterior oropharynx.  TMs are clear.    NECK/LYMPHATIC:                       Symmetrical with no elevation of jugular venous pulsation.  Trachea midline. No thyroid enlargement.  No enlarged lymph nodes.    LUNGS:                   Normal respiratory effort with symmetrical lung expansion.  Lungs are clear to auscultation bilaterally, distant breath sounds.  No rales, wheezes, or rhonchi are evident.    HEART:                     There is a regular rate and rhythm.No murmur, rub, or gallop.  There is no peripheral edema.    ABDOMEN:                  Soft  and round with  no hepatosplenomegaly.  Bowel sounds are normoactive.      NEURO:                                The patient is alert and oriented to time, place and person.  Memory appears intact and mood is normal.  No gross sensorimotor deficits are present.    SKIN:  No rash, lesions, bruising.    MUSCULOSKELETAL:  No cyanosis, clubbing of nails, deformities.     DIAGNOSTIC TESTS:  Studies were personally reviewed by me and Dr. Suzie Portela and discussed with the patient.      CXR:      Results for orders placed during the hospital encounter of 03/25/14   XR CHEST PA LAT    Narrative Chest PA and lateral views    Indication: Sally patient history of asthma unspecified asthma severity  uncomplicated for 10 years. Chronic cough for 2 months.    Findings: No prior studies for comparison. Lungs are clear and normally expanded  with normal cardiomediastinal silhouette. No pleural or acute gross chest wall  abnormalities. The gallbladder is surgically absent.      Impression Impression: No acute abnormality.           Spirometry - 03/25/2014 - normal.          ASSESSMENT:     Encounter Diagnoses   Name Primary?   ??? Cough Yes   ??? Hx of seasonal allergies      Has seasonal cough, "bronchitis,"  For a number of years.  Seen by allergist in past, + for trees, pollen, mold, cat.  ? Cough variant asthma, mild intermittent, seasonal.    Had improvement in cough with advair in past, when tx'd for "bronchitis."   Has had improvement, but not resolution, of cough with recent RX of asmanex. Cough also improves with albuterol MDI as well, averages 1 time/week.    Denies GERD.    PLAN:    The patient was seen and examined with Dr. Suzie Portela.  Methacholine challenge study. Further recommendations to follow after study.  Until she is advised to hold asmanex for study, can increase to 2 puffs twice daily, rinse mouth after use. Sample of asmanex HFA 100ug provided, reviewed MDI technique.  May use albuterol inhaler, 2 puffs up to 4 times daily if needed for wheezing, cough or shortness of breath.  Can hold zyrtec for now.  Follow up in 6 weeks, sooner if needed.    Total  time spent with patient - 60  min.   Over 50% of today's office visit was spent in face to face time reviewing test results, prognosis, importance of compliance, education about disease  process, benefits of medications, instructions for management of acute symptoms, and follow up plans.     Supervising MD - Dr. Audie Box, NP    Electronically Signed. Dictated using voice recognition software.  Proof read but unrecognized errors may exist.

## 2014-03-25 NOTE — Patient Instructions (Addendum)
The patient was seen and examined with Dr. Suzie PortelaPayne.  Methacholine challenge study. Further recommendations to follow after study.  Until she is advised to hold asmanex for study, can increase to 2 puffs twice daily, rinse mouth after use. Sample of asmanex HFA 100ug provided, reviewed MDI technique.  May use albuterol inhaler, 2 puffs up to 4 times daily if needed for wheezing, cough or shortness of breath.  Can hold zyrtec for now.  Follow up in 6 weeks, sooner if needed.  ________________________________________________________________________________________________    [facility]  [address]  [place], [place] [id number]  [phone]      An appointment for a METHACHOLINE CHALLENGE STUDY has been scheduled for you at [facility]    DATE: 04/09/14   TIME: 9:00 arrive at 8:45      The following preparations are necessary for an accurate study:    1.  ALL MDIs, antihistamines, and oral steroids need to be stopped 72 hours prior to testing.    STOP: ProAir, Asmanex, and Zyrtec      2.  NO caffeine, exercise, or smoking 6 hours prior to test.

## 2014-04-09 ENCOUNTER — Ambulatory Visit: Admit: 2014-04-09 | Discharge: 2014-04-09 | Payer: BLUE CROSS/BLUE SHIELD | Primary: Family Medicine

## 2014-04-09 DIAGNOSIS — R059 Cough, unspecified: Secondary | ICD-10-CM

## 2014-04-09 MED ORDER — METHACHOLINE CHLORIDE 100 MG SOLUTION FOR INHALATION
100 mg | Freq: Once | RESPIRATORY_TRACT | Status: AC
Start: 2014-04-09 — End: 2014-04-09

## 2014-04-09 NOTE — Progress Notes (Signed)
Methacholine challenge performed. Pt stated she followed all instructions prior to testing. Results reviewed by Dr. Nelle DonAwan. Pt to continue on current therapy and keep follow up appt per Dr. Nelle DonAwan.

## 2014-04-12 NOTE — Progress Notes (Signed)
Quick Note:        Technically negative but had improvement (720cc) in FEF 25 - 75% with BD. She already has asmanex and PFM. Called her, advised to resume asmanex bid, prn albuterol and record PFM readings until appt next week.    ______

## 2014-04-30 ENCOUNTER — Encounter: Attending: Obstetrics & Gynecology | Primary: Family Medicine

## 2014-04-30 ENCOUNTER — Encounter

## 2014-04-30 ENCOUNTER — Inpatient Hospital Stay: Admit: 2014-04-30 | Payer: PRIVATE HEALTH INSURANCE | Attending: Family Medicine | Primary: Family Medicine

## 2014-04-30 DIAGNOSIS — Z1231 Encounter for screening mammogram for malignant neoplasm of breast: Secondary | ICD-10-CM

## 2014-05-05 ENCOUNTER — Ambulatory Visit
Admit: 2014-05-05 | Discharge: 2014-05-05 | Payer: PRIVATE HEALTH INSURANCE | Attending: Acute Care | Primary: Family Medicine

## 2014-05-05 DIAGNOSIS — R059 Cough, unspecified: Secondary | ICD-10-CM

## 2014-05-05 NOTE — Progress Notes (Signed)
[facility]  [address]  [place], [place] [address]  [phone]    Patient Name:  Sally [name]    Date of Birth:  06-27-1970    Office Visit 05/05/2014      CHIEF COMPLAINT:      Chief Complaint   Patient presents with   ??? Follow-up   ??? Cough   ??? Allergic Rhinitis       HISTORY OF PRESENT ILLNESS:     Seen recently due to cough, hx of seasonal cough, "bronchitis,"?? For a number of years.?? Seen by allergist in past, + for trees, pollen, mold, cat.?? ? Cough variant asthma, mild intermittent, seasonal.    Had improvement in cough with advair in past, when tx'd for "bronchitis."???? Has had essential resolution of cough with asmanex. No wheezing or shortness of breath.  She has not required use of albuterol inhaler recently.    Methacholine challenge was performed To assess for cough variant asthma, consistent with essentially a negative study, but smaller airways did noted marked reversibility post bronchodilators.  Please note FEF 25-75 improved by 720 mL from its lowest value during level 4, post bronchodilators.        Past Medical History   Diagnosis Date   ??? Headache    ??? Abnormal Pap smear    ??? Calculus of kidney    ??? Thyroid disease        Problem List  Date Reviewed: 03-26-14          Codes Class Noted    Cough ICD-10-CM: R05  ICD-9-CM: 786.2  03-26-14        Hx of seasonal allergies ICD-10-CM: Z88.9  ICD-9-CM: V15.09  03/26/2014        Screening for malignant neoplasm of the rectum ICD-10-CM: Z12.12  ICD-9-CM: V76.41  04/24/2013        Routine gynecological examination ICD-10-CM: Z01.419  ICD-9-CM: V72.31  04/24/2013        Irregular menses ICD-10-CM: N92.6  ICD-9-CM: 626.4  04/24/2013        Asthma ICD-10-CM: J45.909  ICD-9-CM: 493.90  03/06/2013        Unspecified hypothyroidism ICD-10-CM: E03.9  ICD-9-CM: 244.9  02/05/2013        Migraine ICD-10-CM: G43.909  ICD-9-CM: 346.90  02/05/2013    Overview Addendum 02/05/2013 11:13 AM by Louie Bun, MD      DId try Topamax - got kidney stone             Allergic rhinitis due to pollen ICD-10-CM: J30.1  ICD-9-CM: 477.0  02/05/2013              Past Surgical History   Procedure Laterality Date   ??? Hx cholecystectomy     ??? Hx tonsillectomy     ??? Hx orthopaedic  2011     left plantar fasciitis surgery   ??? Hx orthopaedic  1999     left 3rd finger cyst removed       DIAGNOSTICS:    Spirometry 03/2014 - Normal spirometry.  Prior PFTs were suggestive of obstruction in the past.  CXR 03/2014 - no acute process.  Methacholine challenge 03/2014 -  consistent with essentially a negative study, but smaller airways did noted marked reversibility post bronchodilators.  Please note FEF 25-75 improved by 720 mL from its lowest value during level 4, post bronchodilators.    History     Social History   ??? Marital Status: MARRIED  Spouse Name: N/A     Number of Children: N/A   ??? Years of Education: N/A     Social History Main Topics   ??? Smoking status: Never Smoker    ??? Smokeless tobacco: Never Used   ??? Alcohol Use: No   ??? Drug Use: No   ??? Sexual Activity:     Partners: Male     Birth Control/ Protection: Pill     Other Topics Concern   ??? Not on file     Social History Narrative    Lifelong nonsmoker.  Occasional EtOH use.  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  She has lived in [place].  He has indoor dog.       Family History   Problem Relation Age of Onset   ??? Hypertension Mother    ??? Thyroid Disease Mother    ??? Elevated Lipids Mother    ??? Gall Bladder Disease Mother    ??? Migraines Mother    ??? Diabetes Father    ??? Diabetes Maternal Grandmother    ??? Cancer Maternal Grandmother 26     lung cancer - non smoker   ??? Lung Disease Maternal Grandmother    ??? Diabetes Paternal Grandmother    ??? Diabetes Paternal Grandfather    ??? Cancer Maternal Grandfather      cirrhosis       Allergies   Allergen Reactions    ??? Codeine Nausea and Vomiting       Current Outpatient Prescriptions   Medication Sig   ??? norgestimate-ethinyl estradiol (ORTHO-CYCLEN) 0.25-35 mg-mcg tab Take 1 Tab by mouth daily.   ??? multivitamin, tx-iron-ca-min (THERA-M W/ IRON) 9 mg iron-400 mcg tab tablet Take 1 Tab by mouth daily.   ??? mometasone (ASMANEX TWISTHALER) 110 mcg (30 doses) aepb inhaler Take 1 Puff by inhalation daily.   ??? albuterol (PROAIR HFA) 90 mcg/actuation inhaler Take 2 Puffs by inhalation every four (4) hours as needed for Wheezing.   ??? levothyroxine (SYNTHROID) 100 mcg tablet Take 1 Tab by mouth Daily (before breakfast).   ??? cetirizine (ZYRTEC) 10 mg tablet Take  by mouth daily.   ??? rizatriptan (MAXALT-MLT) 10 mg disintegrating tablet Take 10 mg by mouth once as needed for Migraine.     No current facility-administered medications for this visit.       REVIEW OF SYSTEMS:    CONSTITUTIONAL:  There is no history of fever, chills, night sweats, weight loss, weight gain, persistent fatigue, or lethargy/hypersomnolence.    CARDIAC:   No chest pain, pressure, discomfort, palpitations, orthopnea, murmurs, or edema.    GI:  No dysphagia, heartburn reflux, nausea/vomiting, diarrhea, abdominal pain, or bleeding.    NEURO:   There is no history of AMS, persistent headache, decreased level of consciousness, seizures, or motor or sensory deficits.      PHYSICAL EXAM:    Visit Vitals   Item Reading   ??? BP 129/77 mmHg   ??? Pulse 90   ??? Temp(Src) 98.2 ??F (36.8 ??C) (Oral)   ??? Resp 16   ??? Ht  (1.6 m)   ??? Wt 193 lb 9.6 oz (87.816 kg)   ??? BMI 34.30 kg/m2   ??? SpO2 97%        GENERAL APPEARANCE:  The patient is in no respiratory distress.    HEENT:  PERRL.  Conjunctivae unremarkable.  Posterior oropharynx is  moist, pink, no exudate or lesions.      NECK/LYMPHATIC:   Symmetrical with no elevation of jugular venous pulsation.  Trachea midline. No thyroid enlargement.  No cervical adenopathy.    LUNGS:    Normal respiratory effort with symmetrical lung expansion.   Breath sounds - completely clear.    HEART:   There is a regular rate and rhythm.  No murmur, rub, or gallop.  There is no edema in the lower extremities.    ABDOMEN:  Soft and non-tender.  No hepatosplenomegaly.  Bowel sounds are normal.      NEURO:  The patient is alert and oriented to person, place, and time.  Memory appears intact and mood is normal.  No gross sensorimotor deficits are present.    DIAGNOSTIC TESTS:  Studies were personally reviewed by me and discussed with the patient.       Methacholine challenge was performed, consistent with essentially a negative study, but smaller airways did noted marked reversibility post bronchodilators.  Please note FEF 25-75 improved by 720 mL from its lowest value during level 4, post bronchodilators.     ASSESSMENT:  (Medical Decision Making)      Encounter Diagnoses   Name Primary?   ??? Cough Yes   ??? Hx of seasonal allergies    ??? Small airways disease       Her cough has essentially resolved after being on Asmanex 2 puffs twice daily.  She has not required use of albuterol inhaler recently.  She is monitoring her peak flow and personal best is 480, lowest is 360. While her methacholine challenge study was technically negative, she did have marked reversibility following bronchodilator with the small airways.    Allergy symptoms are well controlled, she noticed a significant increase in symptoms while holding the Zyrtec for methacholine study.    PLAN:     Given her symptomatic improvement with inhaled steroid, will continue Asmanex 2 puffs twice daily.  May use albuterol 2 puffs 4 times daily if needed.  Advised when to contact us.  She is aware of known triggers and will continue to practice avoidance.  Continue Zyrtec daily.  May need to consider adding nasal steroid or Singulair later if she has increase in symptoms.  When she returns, if she remains stable, will consider decreasing therapy.     Follow-up Disposition:  Return in about 6 months (around 11/03/2014) for Dr. Suzie PortelaPayne or [name] w/ PFT's.     Melayah Skorupski P Travas Schexnayder, NP    Total  time spent with patient -20  min.   Over 50% of today's office visit was spent in face to face time reviewing test results, prognosis, importance of compliance, education about disease process, benefits of medications, instructions for management of acute symptoms, and follow up plans.     Supervising MD: Dr. Krista BlueElbayoumi    Electronically signed. Dictated using voice recognition software.  Proof read but unrecognized errors may exist.

## 2014-05-14 ENCOUNTER — Ambulatory Visit: Payer: BLUE CROSS/BLUE SHIELD | Primary: Family Medicine

## 2014-05-17 ENCOUNTER — Encounter: Primary: Family Medicine

## 2014-06-17 MED ORDER — MOMETASONE 110 MCG (30 DOSES) BREATH ACTIVATED POWDER AEROSOL
110 mcg/ actuation (30) | RESPIRATORY_TRACT | Status: DC
Start: 2014-06-17 — End: 2014-11-04

## 2014-06-17 NOTE — Telephone Encounter (Signed)
Pt left a message on the refill line stating that she has been using Asmanex Inh 2 puffs bid. She states that her PCP sent a refill into the pharmacy but the instructions are 1 puff every day. Pt states that when seen last here in the office she was told to take 2 puffs bid. Would you like to refill this medication? [name] L [name], CMA

## 2014-06-17 NOTE — Telephone Encounter (Signed)
I refilled it at 2 inhalations twice daily.

## 2014-08-04 NOTE — Telephone Encounter (Signed)
error 

## 2014-11-03 ENCOUNTER — Encounter: Attending: Acute Care | Primary: Family Medicine

## 2014-11-04 ENCOUNTER — Ambulatory Visit
Admit: 2014-11-04 | Discharge: 2014-11-04 | Payer: PRIVATE HEALTH INSURANCE | Attending: Acute Care | Primary: Family Medicine

## 2014-11-04 DIAGNOSIS — R059 Cough, unspecified: Secondary | ICD-10-CM

## 2014-11-04 LAB — AMB POC SPIROMETRY

## 2014-11-04 MED ORDER — BECLOMETHASONE DIPROPIONATE 40 MCG/ACTUATION AEROSOL INHALER
40 mcg/actuation | RESPIRATORY_TRACT | Status: DC
Start: 2014-11-04 — End: 2014-12-30

## 2014-11-04 MED ORDER — MONTELUKAST 10 MG TAB
10 mg | ORAL_TABLET | ORAL | Status: DC
Start: 2014-11-04 — End: 2015-05-22

## 2014-11-04 NOTE — Progress Notes (Signed)
Patient Name:  Sally [name]  Date of Birth:  [birth year]    Office Visit 11/04/2014    CHIEF COMPLAINT:    Chief Complaint   Patient presents with   ??? Follow-up   ??? Cough   ??? Allergic Rhinitis       HISTORY OF PRESENT ILLNESS:     Has been stable except for clearing of throat recently and has significant amount of clear drainage in posterior oropharynx, despite taking zyrtec. Cough is substantially better, nearly resolved.  No definite wheezing or shortness of breath.    Insurance does not reimburse well for asmanex and pt reports that because of that, pharmacy was dispensing amount less than prescribed.  Therefore, she Decreased asmanex to 1 inhalation twice daily to make it last longer.  She has not required PROAIR inhaler.      Past Medical History   Diagnosis Date   ??? Headache    ??? Abnormal Pap smear    ??? Calculus of kidney    ??? Thyroid disease        Problem List  Date Reviewed: 05/05/2014          Codes Class Noted    Small airways disease (Chronic) ICD-10-CM: J98.4  ICD-9-CM: 518.89  05/05/2014        Cough ICD-10-CM: R05  ICD-9-CM: 786.2  03/25/2014        Hx of seasonal allergies (Chronic) ICD-10-CM: Z88.9  ICD-9-CM: V15.09  03/25/2014        Screening for malignant neoplasm of the rectum ICD-10-CM: Z12.12  ICD-9-CM: V76.41  04/24/2013        Routine gynecological examination ICD-10-CM: Z01.419  ICD-9-CM: V72.31  04/24/2013        Irregular menses ICD-10-CM: N92.6  ICD-9-CM: 626.4  04/24/2013        Unspecified hypothyroidism ICD-10-CM: E03.9  ICD-9-CM: 244.9  02/05/2013        Migraine ICD-10-CM: G43.909  ICD-9-CM: 346.90  02/05/2013    Overview Addendum 02/05/2013 11:13 AM by Louie BunJulie M Dangler, MD     DId try Topamax - got kidney stone             Allergic rhinitis due to pollen ICD-10-CM: J30.1  ICD-9-CM: 477.0  02/05/2013               Past Surgical History   Procedure Laterality Date   ??? Hx cholecystectomy     ??? Hx tonsillectomy     ??? Hx orthopaedic  2011     left plantar fasciitis surgery   ??? Hx orthopaedic  1999      left 3rd finger cyst removed     DIAGNOSTICS:    Spirometry 03/2014 - Normal spirometry.?? Prior PFTs were suggestive of obstruction in the past.  CXR 03/2014 - no acute process.  Methacholine challenge 03/2014 -?? consistent with essentially a negative study, but smaller airways noted marked reversibility post bronchodilators.?? Please note FEF 25-75 improved by 720 mL from its lowest value during level 4, post bronchodilators      Family History   Problem Relation Age of Onset   ??? Hypertension Mother    ??? Thyroid Disease Mother    ??? Elevated Lipids Mother    ??? Gall Bladder Disease Mother    ??? Migraines Mother    ??? Diabetes Father    ??? Diabetes Maternal Grandmother    ??? Cancer Maternal Grandmother [phone]     lung cancer - non smoker   ??? Lung Disease Maternal Grandmother    ???  Diabetes Paternal Grandmother    ??? Diabetes Paternal Grandfather    ??? Cancer Maternal Grandfather      cirrhosis       History     Social History   ??? Marital Status: MARRIED     Spouse Name: N/A   ??? Number of Children: N/A   ??? Years of Education: N/A     Social History Main Topics   ??? Smoking status: Never Smoker    ??? Smokeless tobacco: Never Used   ??? Alcohol Use: No   ??? Drug Use: No   ??? Sexual Activity:     Partners: Male     Birth Control/ Protection: Pill     Other Topics Concern   ??? None     Social History Narrative    Lifelong nonsmoker.  Occasional EtOH use.  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  She has lived in [place].  He has indoor dog.       Allergies   Allergen Reactions   ??? Codeine Nausea and Vomiting       Current Outpatient Prescriptions   Medication Sig   ??? mometasone (ASMANEX TWISTHALER) 110 mcg (30 doses) aepb inhaler 2 inhalations bid, rinse mouth after use.   ??? norgestimate-ethinyl estradiol (ORTHO-CYCLEN) 0.25-35 mg-mcg tab Take 1 Tab by mouth daily.   ??? multivitamin, tx-iron-ca-min (THERA-M W/ IRON) 9 mg iron-400 mcg tab  tablet Take 1 Tab by mouth daily.   ??? albuterol (PROAIR HFA) 90 mcg/actuation inhaler Take 2 Puffs by inhalation every four (4) hours as needed for Wheezing.   ??? levothyroxine (SYNTHROID) 100 mcg tablet Take 1 Tab by mouth Daily (before breakfast). (Patient taking differently: Take 88 mcg by mouth Daily (before breakfast).)   ??? cetirizine (ZYRTEC) 10 mg tablet Take  by mouth daily.   ??? rizatriptan (MAXALT-MLT) 10 mg disintegrating tablet Take 10 mg by mouth once as needed for Migraine.     No current facility-administered medications for this visit.       REVIEW OF SYSTEMS:          CONSTITUTIONAL:  There is no history of fever, chills, night sweats, weight loss, weight gain, persistent fatigue, or lethargy/hypersomnolence.    CARDIAC:  No chest pain, pressure, discomfort, palpitations, orthopnea, murmurs, or edema.     PHYSICAL EXAM:         Visit Vitals   Item Reading   ??? BP 134/89 mmHg   ??? Pulse 91   ??? Temp(Src) 98.1 ??F (36.7 ??C) (Oral)   ??? Resp 16   ??? Ht  (1.6 m)   ??? Wt 202 lb 4.8 oz (91.763 kg)   ??? BMI 35.84 kg/m2   ??? SpO2 96%       GENERAL APPEARANCE:  The patient is obese and in no respiratory distress.    HEENT:  PERRL.  Conjunctivae unremarkable.    Nasal mucosa is without epistaxis, exudate, or polyps. Posterior oropharynx is moist and pink, there is clear drainage in posterior oropharynx.  No thrush.     LUNGS:  Normal respiratory effort with symmetrical lung expansion.  Lung sounds - decreased but completely clear.    HEART:  There is a regular rate and rhythm.  No murmur, rub, or gallop.  There is no edema in lower extremities.     NEURO:  The patient is alert and oriented to person, place, and  time.  Memory appears intact and mood is normal.  No gross sensorimotor deficits are present.    DIAGNOSTIC TESTS:  Studies were personally reviewed by me and discussed with the patient.     CXR:   Results for orders placed during the hospital encounter of 03/25/14   XR CHEST PA LAT     Narrative Chest PA and lateral views    Indication: [facility] patient history of asthma unspecified asthma severity  uncomplicated for 10 years. Chronic cough for 2 months.    Findings: No prior studies for comparison. Lungs are clear and normally expanded  with normal cardiomediastinal silhouette. No pleural or acute gross chest wall  abnormalities. The gallbladder is surgically absent.      Impression Impression: No acute abnormality.         Spirometry:         ASSESSMENT:   (Medical Decision Making)     Encounter Diagnoses   Name Primary?   ??? Cough Yes   ??? Allergic rhinitis due to pollen    ??? Small airways disease       Has been stable except for clearing of throat recently and has significant amount of clear drainage in posterior oropharynx, despite taking zyrtec.    Insurance does not reimburse well for asmanex and pt reports that because of that, pharmacy was dispensing amount less than prescribed.  Therefore, she Decreased asmanex to 1 inhalation twice daily to make it last longer.  She has not required PROAIR inhaler.    Spirometry is normal, no significant interval change.    PLAN:     Increase asmanex to 2 inhalations daily, after completing supply, begin qvar 40 ug, 2 puffs twice daily, rinse mouth after use.  May use pro air 2 puffs 4 times daily if needed.  Begin singulair 10mg  at bedtime.  Continue zyrtec daily.  After fall seasonal changes, Which are typically the worst time of year for her, if she is stable, she can attempt to decrease qvar to 1 puff twice daily.  Follow up in 6 months with spirometry, sooner if needed.      Orders Placed This Encounter   ??? AMB POC SPIROMETRY   ??? beclomethasone (QVAR) 40 mcg/actuation inhaler     Sig: 2 puffs bid, rinse mouth after use.     Dispense:  1 Inhaler     Refill:  11   ??? montelukast (SINGULAIR) 10 mg tablet     Sig: 1 po q hs     Dispense:  30 Tab     Refill:  6        Total  time spent with patient - 20 min.    Over 50% of today's office visit was spent in face to face time reviewing test results, prognosis, importance of compliance, education about disease process, benefits of medications, instructions for management of acute symptoms, and follow up plans.     Supervising MD: Dr. Audie Box, NP    Electronically signed.  Dictated using voice recognition software.  Proof read but unrecognized errors may exist.

## 2014-11-04 NOTE — Patient Instructions (Signed)
Increase asmanex to 2 inhalations daily, after completing supply, begin qvar 40 ug, 2 puffs twice daily, rinse mouth after use.  May use pro air 2 puffs 4 times daily if needed.  Begin singulair  at bedtime.  Continue zyrtec daily.  After fall seasonal changes, if stable, she can attempt to decrease qvar to 1 puff twice daily.  Follow up in 6 months with spirometry, sooner if needed.

## 2014-12-30 MED ORDER — BECLOMETHASONE DIPROPIONATE 40 MCG/ACTUATION AEROSOL INHALER
40 mcg/actuation | RESPIRATORY_TRACT | 11 refills | Status: DC
Start: 2014-12-30 — End: 2015-11-02

## 2014-12-30 NOTE — Telephone Encounter (Signed)
[facility] called the refill line regards to this patient.  They stated that the patient told them that she was supposed to get a new type of inhaler instead of their Asmanex.  I reviewed Sally [name] note and it looks like the patient was supposed to be switched to Qvar once they have completed their supply of Asmanex.  It also looks like look wrote the prescription for Qvar but it was printed.  I contacted the patient in regards to this and she stated that she does not have the prescription on hand.  Qvar has been e-scribed to [employer]. // Dorris Carnes M.A.

## 2015-01-11 ENCOUNTER — Ambulatory Visit
Admit: 2015-01-11 | Discharge: 2015-01-11 | Payer: PRIVATE HEALTH INSURANCE | Attending: Family Medicine | Primary: Family Medicine

## 2015-01-11 DIAGNOSIS — M533 Sacrococcygeal disorders, not elsewhere classified: Secondary | ICD-10-CM

## 2015-01-11 MED ORDER — METHYLPREDNISOLONE 4 MG TABS IN A DOSE PACK
4 mg | ORAL | 0 refills | Status: DC
Start: 2015-01-11 — End: 2015-02-24

## 2015-01-11 NOTE — Patient Instructions (Signed)
Coccyx Pain: Care Instructions  Your Care Instructions  The coccyx is your tailbone. You can have pain in your tailbone from a fall or other injury. Pregnancy and childbirth also can cause tailbone pain. Sometimes, the cause of pain is not known. A tailbone injury causes pain when you sit, especially when you slump or sit on a hard seat. Straining to have a bowel movement also can be very painful.  Tailbone injuries can take several months to heal, but in some cases the pain goes even longer. You can take steps at home to ease the pain. In some cases, a doctor injects a corticosteroid medicine into the coccyx to reduce swelling and pain.  Follow-up care is a key part of your treatment and safety. Be sure to make and go to all appointments, and call your doctor if you are having problems. It's also a good idea to know your test results and keep a list of the medicines you take.  How can you care for yourself at home?  ?? Take pain medicines exactly as directed.  ?? If the doctor gave you a prescription medicine for pain, take it as prescribed.  ?? If you are not taking a prescription pain medicine, take an over-the-counter medicine to reduce pain.  ?? Put ice or a cold pack on your tailbone for 10 to 20 minutes at a time. Try to do this every 1 to 2 hours for the next 3 days (when you are awake) or until the swelling goes down. Put a thin cloth between the ice and your skin.  ?? About 2 or 3 days after your injury, you can alternate ice and heat. To soothe the tailbone area, take a warm bath for 20 minutes, 3 or 4 times a day.  ?? Sit on soft, padded surfaces. A doughnut-shaped pillow can take pressure off the tailbone.  ?? Avoid constipation, because straining to have a bowel movement will increase your tailbone pain.  ?? Include fruits, vegetables, beans, and whole grains in your diet each day. These foods are high in fiber.  ?? Drink plenty of fluids, enough so that your urine is light yellow or  clear like water. If you have kidney, heart, or liver disease and have to limit fluids, talk with your doctor before you increase the amount of fluids you drink.  ?? Get some exercise every day. Build up slowly to 30 to 60 minutes a day on 5 or more days of the week.  ?? Take a fiber supplement, such as Citrucel or Metamucil, every day if needed. Read and follow all instructions on the label.  ?? Schedule time each day for a bowel movement. A daily routine may help. Take your time and do not strain when having a bowel movement.  ?? Follow your doctor's directions for stretching and other exercises that might help with pain.  When should you call for help?  Call 911 anytime you think you may need emergency care. For example, call if:  ?? You are unable to move a leg at all.  Call your doctor now or seek immediate medical care if:  ?? You have new or worse symptoms in your legs or buttocks. Symptoms may include:  ?? Numbness or tingling.  ?? Weakness.  ?? Pain.  ?? You lose bladder or bowel control.  Watch closely for changes in your health, and be sure to contact your doctor if:  ?? You are not getting better as expected.  Where can you   learn more?  Go to http://www.healthwise.net/[facility]  Enter N853 in the search box to learn more about "Coccyx Pain: Care Instructions."  ?? 2006-2016 [employer]. Care instructions adapted under license by [employer] (which disclaims liability or warranty for this information). This care instruction is for use with your licensed healthcare professional. If you have questions about a medical condition or this instruction, always ask your healthcare professional. Healthwise, Incorporated disclaims any warranty or liability for your use of this information.  Content Version: 10.9.538570; Current as of: Sep 11, 2013

## 2015-01-11 NOTE — Progress Notes (Signed)
Sally [name] is a 44 y.o. female that is being seen for Tailbone Pain (right hip)    Patient noted pain intermittently in "tailbone" for 2 weeks.   Had some prolonged sitting prior to onset. No injury. Worse a couple of days ago. Radiates to right buttock  Has taken ibuprofen  Helps some    No constipation  No rash          Allergies:  Allergies   Allergen Reactions   ??? Codeine Nausea and Vomiting       Current Meds:  Current Outpatient Prescriptions   Medication Sig Dispense Refill   ??? levonorgestrel (MIRENA) 20 mcg/24 hr (5 years) IUD 1 Each by IntraUTERine route.     ??? methylPREDNISolone (MEDROL, PAK,) 4 mg tablet Take as directed 1 Dose Pack 0   ??? methylPREDNISolone (MEDROL, PAK,) 4 mg tablet Take as directed 1 Dose Pack 0   ??? beclomethasone (QVAR) 40 mcg/actuation inhaler 2 puffs bid, rinse mouth after use. 1 Inhaler 11   ??? montelukast (SINGULAIR) 10 mg tablet 1 po q hs 30 Tab 6   ??? multivitamin, tx-iron-ca-min (THERA-M W/ IRON) 9 mg iron-400 mcg tab tablet Take 1 Tab by mouth daily.     ??? albuterol (PROAIR HFA) 90 mcg/actuation inhaler Take 2 Puffs by inhalation every four (4) hours as needed for Wheezing. 1 Inhaler 1   ??? levothyroxine (SYNTHROID) 100 mcg tablet Take 1 Tab by mouth Daily (before breakfast). (Patient taking differently: Take 88 mcg by mouth Daily (before breakfast).) 90 Tab 3   ??? cetirizine (ZYRTEC) 10 mg tablet Take  by mouth daily.     ??? rizatriptan (MAXALT-MLT) 10 mg disintegrating tablet Take 10 mg by mouth once as needed for Migraine.         PMH:  Past Medical History   Diagnosis Date   ??? Abnormal Pap smear    ??? Calculus of kidney    ??? Headache    ??? Thyroid disease        PSH:  Past Surgical History   Procedure Laterality Date   ??? Hx cholecystectomy     ??? Hx tonsillectomy     ??? Hx orthopaedic  2011     left plantar fasciitis surgery   ??? Hx orthopaedic  1999     left 3rd finger cyst removed       FH:  Family History   Problem Relation Age of Onset   ??? Hypertension Mother     ??? Thyroid Disease Mother    ??? Elevated Lipids Mother    ??? Gall Bladder Disease Mother    ??? Migraines Mother    ??? Diabetes Father    ??? Diabetes Maternal Grandmother    ??? Cancer Maternal Grandmother 31     lung cancer - non smoker   ??? Lung Disease Maternal Grandmother    ??? Diabetes Paternal Grandmother    ??? Diabetes Paternal Grandfather    ??? Cancer Maternal Grandfather      cirrhosis       Social Hx:  Social History     Social History   ??? Marital status: MARRIED     Spouse name: N/A   ??? Number of children: N/A   ??? Years of education: N/A     Social History Main Topics   ??? Smoking status: Never Smoker   ??? Smokeless tobacco: Never Used   ??? Alcohol use No   ??? Drug use: No   ??? Sexual activity:  Yes     Partners: Male     Birth control/ protection: IUD     Other Topics Concern   ??? None     Social History Narrative    Lifelong nonsmoker.  Occasional EtOH use.  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  She has lived in [place].  He has indoor dog.       Physical Exam:  BP Readings from Last 3 Encounters:   01/11/15 122/88   11/04/14 134/89   05/05/14 129/77     Wt Readings from Last 3 Encounters:   01/11/15 201 lb (91.2 kg)   11/04/14 202 lb 4.8 oz (91.8 kg)   05/05/14 193 lb 9.6 oz (87.8 kg)     Estimated body mass index is 35.61 kg/(m^2) as calculated from the following:    Height as of this encounter:  (1.6 m).    Weight as of this encounter: 201 lb (91.2 kg).    Visit Vitals   ??? BP 122/88 (BP 1 Location: Left arm, BP Patient Position: Sitting)   ??? Pulse 80   ??? Temp 98.5 ??F (36.9 ??C) (Oral)   ??? Resp 16   ??? Ht  (1.6 m)   ??? Wt 201 lb (91.2 kg)   ??? BMI 35.61 kg/m2     General appearance: alert, cooperative, no distress, appears stated age  Head: Normocephalic, without obvious abnormality, atraumatic  Eyes: negative findings: anicteric sclera, lids and lashes normal and conjunctivae and sclerae normal   Neck: supple, symmetrical, trachea midline  Skin: Skin color, texture, turgor normal. No rashes or lesions  + tenderness over coccyx  Difficulty sitting for exam    No results found for any visits on 01/11/15.    Assessment and Plan:  Sunni was seen today for tailbone pain.    Diagnoses and all orders for this visit:    Coccydynia  -     methylPREDNISolone (MEDROL, PAK,) 4 mg tablet; Take as directed  -     methylPREDNISolone (MEDROL, PAK,) 4 mg tablet; Take as directed        Albertha Ghee, MD    Patient Instructions     Coccyx Pain: Care Instructions  Your Care Instructions  The coccyx is your tailbone. You can have pain in your tailbone from a fall or other injury. Pregnancy and childbirth also can cause tailbone pain. Sometimes, the cause of pain is not known. A tailbone injury causes pain when you sit, especially when you slump or sit on a hard seat. Straining to have a bowel movement also can be very painful.  Tailbone injuries can take several months to heal, but in some cases the pain goes even longer. You can take steps at home to ease the pain. In some cases, a doctor injects a corticosteroid medicine into the coccyx to reduce swelling and pain.  Follow-up care is a key part of your treatment and safety. Be sure to make and go to all appointments, and call your doctor if you are having problems. It's also a good idea to know your test results and keep a list of the medicines you take.  How can you care for yourself at home?  ?? Take pain medicines exactly as directed.  ?? If the doctor gave you a prescription medicine for pain, take it as prescribed.  ?? If you are not taking a prescription pain  medicine, take an over-the-counter medicine to reduce pain.  ?? Put ice or a cold pack on your tailbone for 10 to 20 minutes at a time. Try to do this every 1 to 2 hours for the next 3 days (when you are awake) or until the swelling goes down. Put a thin cloth between the ice and your skin.   ?? About 2 or 3 days after your injury, you can alternate ice and heat. To soothe the tailbone area, take a warm bath for 20 minutes, 3 or 4 times a day.  ?? Sit on soft, padded surfaces. A doughnut-shaped pillow can take pressure off the tailbone.  ?? Avoid constipation, because straining to have a bowel movement will increase your tailbone pain.  ?? Include fruits, vegetables, beans, and whole grains in your diet each day. These foods are high in fiber.  ?? Drink plenty of fluids, enough so that your urine is light yellow or clear like water. If you have kidney, heart, or liver disease and have to limit fluids, talk with your doctor before you increase the amount of fluids you drink.  ?? Get some exercise every day. Build up slowly to 30 to 60 minutes a day on 5 or more days of the week.  ?? Take a fiber supplement, such as Citrucel or Metamucil, every day if needed. Read and follow all instructions on the label.  ?? Schedule time each day for a bowel movement. A daily routine may help. Take your time and do not strain when having a bowel movement.  ?? Follow your doctor's directions for stretching and other exercises that might help with pain.  When should you call for help?  Call 911 anytime you think you may need emergency care. For example, call if:  ?? You are unable to move a leg at all.  Call your doctor now or seek immediate medical care if:  ?? You have new or worse symptoms in your legs or buttocks. Symptoms may include:  ?? Numbness or tingling.  ?? Weakness.  ?? Pain.  ?? You lose bladder or bowel control.  Watch closely for changes in your health, and be sure to contact your doctor if:  ?? You are not getting better as expected.  Where can you learn more?  Go to [employer]  Enter [phone] in the search box to learn more about "Coccyx Pain: Care Instructions."  ?? 2006-2016 [employer]. Care instructions adapted under  license by [employer] (which disclaims liability or warranty for this information). This care instruction is for use with your licensed healthcare professional. If you have questions about a medical condition or this instruction, always ask your healthcare professional. Health[employer], Inc[employer] disclaims any warranty or liability for your use of this information.  Content Version: 10.9.538570; Current as of: Sep 11, 2013             ADDENDUM: HEALTH MAINTENANCE:  Health Maintenance   Topic Date Due   ??? Pneumococcal 19-64 Medium Risk (1 of 1 - PPSV23) 01/17/1989   ??? DTaP/Tdap/Td series (1 - Tdap) 01/18/1991   ??? INFLUENZA AGE 52 TO ADULT  11/22/2014   ??? PAP AKA CERVICAL CYTOLOGY  04/24/2016

## 2015-02-15 ENCOUNTER — Institutional Professional Consult (permissible substitution): Admit: 2015-02-15 | Discharge: 2015-02-15 | Payer: PRIVATE HEALTH INSURANCE | Primary: Family Medicine

## 2015-02-15 DIAGNOSIS — Z23 Encounter for immunization: Secondary | ICD-10-CM

## 2015-02-15 NOTE — Patient Instructions (Signed)
Vaccine Information Statement    Influenza (Flu) Vaccine (Live, Intranasal): What you need to know    Many Vaccine Information Statements are available in Spanish and other languages. See www.immunize.org/vis  Hojas de Informaci??n Sobre Vacunas est??n disponibles en Espa??ol y en muchos otros idiomas. Visite www.immunize.org/vis    1. Why get vaccinated?    Influenza (???flu???) is a contagious disease that spreads around the [place] every year, usually between October and May.     Flu is caused by influenza viruses, and is spread mainly by coughing, sneezing, and close contact.     Anyone can get flu. Flu strikes suddenly and can last several days. Symptoms vary by age, but can include:  ??? fever/chills  ??? sore throat  ??? muscle aches  ??? fatigue  ??? cough  ??? headache   ??? runny or stuffy nose    Flu can also lead to pneumonia and blood infections, and cause diarrhea and seizures in children.  If you have a medical condition, such as heart or lung disease, flu can make it worse.    Flu is more dangerous for some people. Infants and young children, people 65 years of age and older, pregnant women, and people with certain health conditions or a weakened immune system are at greatest risk.      Each year thousands of people in the [place] die from flu, and many more are hospitalized.     Flu vaccine can:  ??? keep you from getting flu,  ??? make flu less severe if you do get it, and  ??? keep you from spreading flu to your family and other people.    2. Live, attenuated flu vaccine ??? LAIV, Nasal Spray    A dose of flu vaccine is recommended every flu season. Children younger than 9 years of age may need two doses during the same flu season.  Everyone else needs only one dose each flu season.     The live, attenuated influenza vaccine (called LAIV) may be given to healthy, non-pregnant people 2 through 45 years of age.  It may safely be given at the same time as other vaccines.     LAIV is sprayed into the nose. LAIV does not contain thimerosal or other preservatives. It is made from weakened flu virus and does not cause flu.    There are many flu viruses, and they are always changing. Each year LAIV is made to protect against four viruses that are likely to cause disease in the upcoming flu season. But even when the vaccine doesn???t exactly match these viruses, it may still provide some protection.     Flu vaccine cannot prevent:  ??? flu that is caused by a virus not covered by the vaccine, or  ??? illnesses that look like flu but are not.    It takes about 2 weeks for protection to develop after vaccination, and protection lasts through the flu season.    3. Some people should not get this vaccine    Some people should not get LAIV because of age, health conditions, or other reasons. Most of these people should get an injected flu vaccine instead. Your healthcare provider can help you decide.    Tell the provider if you or the person being vaccinated:  ??? have any allergies, including an allergy to eggs, or have ever had an allergic reaction to an influenza vaccine.   ??? have ever had Guillain-Barr?? Syndrome (also called GBS).   ???   have any long-term heart, breathing, kidney, liver, or nervous system problems.  ??? have asthma or breathing problems, or are a child who has had wheezing episodes.  ??? are pregnant.  ??? are a child or adolescent who is receiving aspirin or aspirin-containing products.  ??? have a weakened immune system.  ??? will be visiting or taking care of someone, within the next 7 days, who requires a protected environment (for example, following a bone marrow transplant)    Sometimes LAIV should be delayed. Tell the provider if you or the person being vaccinated:  ??? are not feeling well. The vaccine could be delayed until you feel better.  ??? have gotten any other vaccines in the past 4 weeks. Live vaccines given too close together might not work as well.   ??? have taken influenza antiviral medication in the past 48 hours.  ??? have a very stuffy nose.    4. Risks of a vaccine reaction    With any medicine, including vaccines, there is a chance of reactions. These are usually mild and go away on their own, but serious reactions are also possible.     Most people who get LAIV do not have any problems with it. Reactions to LAIV may resemble a very mild case of flu.     Problems that have been reported following LAIV:     Children and adolescents 2-17 years of age:  ??? runny nose/nasal congestion  ??? cough  ??? fever  ??? headache and muscle aches      ??? wheezing   ??? abdominal pain, vomiting, or diarrhea     Adults 18-49 years of age:   ??? runny nose/nasal congestion     ??? sore throat  ??? cough  ??? chills  ??? tiredness/weakness     ??? headache    Problems that could happen after any vaccine:   ??? Any medication can cause a severe allergic reaction. Such reactions from a vaccine are very rare, estimated at about 1 in a million doses, and would happen within a few minutes to a few hours after the vaccination.     As with any medicine, there is a very small chance of a vaccine causing a serious injury or death.    The safety of vaccines is always being monitored.  For more information, visit:   www.cdc.gov/vaccinesafety/    5. What if there is a serious reaction?    What should I look for?    ??? Look for anything that concerns you, such as signs of a severe allergic reaction, very high fever, or unusual behavior.    Signs of a severe allergic reaction can include hives, swelling of the face and throat, difficulty breathing, a fast heartbeat, dizziness, and weakness. These would start a few minutes to a few hours after the vaccination.    What should I do?    ??? If you think it is a severe allergic reaction or other emergency that can???t wait, call 9-1-1 and get the person to the nearest hospital. Otherwise, call your doctor.     ??? Reactions should be reported to the Vaccine Adverse Event Reporting System (VAERS). Your doctor should file this report, or you can do it yourself through the VAERS web site at www.vaers.hhs.gov, or by calling 1-800-822-7967.    VAERS does not give medical advice.    6. The National Vaccine Injury Compensation Program    The National Vaccine Injury Compensation Program (VICP) is a federal program   that was created to compensate people who may have been injured by certain vaccines.    Persons who believe they may have been injured by a vaccine can learn about the program and about filing a claim by calling 1-800-338-2382 or visiting the VICP website at www.hrsa.gov/vaccinecompensation. There is a time limit to file a claim for compensation.    7. How can I learn more?  ??? Ask your doctor. He or she can give you the vaccine package insert or suggest other sources of information.  ??? Call your local or state health department.  ??? Contact the Centers for Disease Control and Prevention (CDC):  - Call 1-800-232-4636 (1-800-CDC-INFO) or  - Visit CDC???s website at www.cdc.gov/flu    Vaccine Information Statement   Live Attenuated Influenza Vaccine   11/27/2013  [place] ?? [id number]    Department of Health and Human Services  Centers for Disease Control and Prevention    Office Use Only

## 2015-02-24 ENCOUNTER — Ambulatory Visit
Admit: 2015-02-24 | Discharge: 2015-02-24 | Payer: PRIVATE HEALTH INSURANCE | Attending: Family Medicine | Primary: Family Medicine

## 2015-02-24 DIAGNOSIS — H9202 Otalgia, left ear: Secondary | ICD-10-CM

## 2015-02-24 MED ORDER — AMOXICILLIN 875 MG TAB
875 mg | ORAL_TABLET | Freq: Two times a day (BID) | ORAL | 0 refills | Status: DC
Start: 2015-02-24 — End: 2015-11-02

## 2015-02-24 NOTE — Progress Notes (Signed)
Sally [name] is a 44 y.o. female that is being seen for Ear Pain (left)      PATIENT with "cracking" sound in left ear for a couple of days  Today with pain in left ear.  PND  No fever  No SOB  No sorethroat.  No CP  No ear drainage        Allergies:  Allergies   Allergen Reactions   ??? Codeine Nausea and Vomiting       Current Meds:  Current Outpatient Prescriptions   Medication Sig Dispense Refill   ??? amoxicillin (AMOXIL) 875 mg tablet Take 1 Tab by mouth two (2) times a day. 20 Tab 0   ??? levonorgestrel (MIRENA) 20 mcg/24 hr (5 years) IUD 1 Each by IntraUTERine route.     ??? beclomethasone (QVAR) 40 mcg/actuation inhaler 2 puffs bid, rinse mouth after use. 1 Inhaler 11   ??? montelukast (SINGULAIR) 10 mg tablet 1 po q hs 30 Tab 6   ??? multivitamin, tx-iron-ca-min (THERA-M W/ IRON) 9 mg iron-400 mcg tab tablet Take 1 Tab by mouth daily.     ??? albuterol (PROAIR HFA) 90 mcg/actuation inhaler Take 2 Puffs by inhalation every four (4) hours as needed for Wheezing. 1 Inhaler 1   ??? levothyroxine (SYNTHROID) 100 mcg tablet Take 1 Tab by mouth Daily (before breakfast). (Patient taking differently: Take 88 mcg by mouth Daily (before breakfast).) 90 Tab 3   ??? cetirizine (ZYRTEC) 10 mg tablet Take  by mouth daily.     ??? rizatriptan (MAXALT-MLT) 10 mg disintegrating tablet Take 10 mg by mouth once as needed for Migraine.         PMH:  Past Medical History   Diagnosis Date   ??? Abnormal Pap smear    ??? Calculus of kidney    ??? Headache    ??? Thyroid disease        PSH:  Past Surgical History   Procedure Laterality Date   ??? Hx cholecystectomy     ??? Hx tonsillectomy     ??? Hx orthopaedic  2011     left plantar fasciitis surgery   ??? Hx orthopaedic  1999     left 3rd finger cyst removed       FH:  Family History   Problem Relation Age of Onset   ??? Hypertension Mother    ??? Thyroid Disease Mother    ??? Elevated Lipids Mother    ??? Gall Bladder Disease Mother    ??? Migraines Mother    ??? Diabetes Father    ??? Diabetes Maternal Grandmother     ??? Cancer Maternal Grandmother [id number]     lung cancer - non smoker   ??? Lung Disease Maternal Grandmother    ??? Diabetes Paternal Grandmother    ??? Diabetes Paternal Grandfather    ??? Cancer Maternal Grandfather      cirrhosis       Social Hx:  Social History     Social History   ??? Marital status: MARRIED     Spouse name: N/A   ??? Number of children: N/A   ??? Years of education: N/A     Social History Main Topics   ??? Smoking status: Never Smoker   ??? Smokeless tobacco: Never Used   ??? Alcohol use No   ??? Drug use: No   ??? Sexual activity: Yes     Partners: Male     Birth control/ protection: IUD     Other  Topics Concern   ??? None     Social History Narrative    Lifelong nonsmoker.  Occasional EtOH use.  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  [place]  [place] dog.       Physical Exam:  BP Readings from Last 3 Encounters:   02/24/15 140/80   01/11/15 122/88   11/04/14 134/89     Wt Readings from Last 3 Encounters:   02/24/15 202 lb (91.6 kg)   01/11/15 201 lb (91.2 kg)   11/04/14 202 lb 4.8 oz (91.8 kg)     Estimated body mass index is 35.78 kg/(m^2) as calculated from the following:    Height as of this encounter:  (1.6 m).    Weight as of this encounter: 202 lb (91.6 kg).    Visit Vitals   ??? BP 140/80 (BP 1 Location: Left arm, BP Patient Position: Sitting)   ??? Pulse 80   ??? Temp 98.6 ??F (37 ??C) (Oral)   ??? Resp 18   ??? Ht  (1.6 m)   ??? Wt 202 lb (91.6 kg)   ??? BMI 35.78 kg/m2     General appearance: alert, cooperative, no distress, appears stated age  Head: Normocephalic, without obvious abnormality, atraumatic  Eyes: negative findings: anicteric sclera, lids and lashes normal and conjunctivae and sclerae normal  Ears: abnormal TM AD - dull, abnormal TM AS - dull  Throat: Lips, mucosa, and tongue normal. Teeth and gums normal and normal  findings: oropharynx pink & moist without lesions or evidence of thrush  Neck: supple, symmetrical, trachea midline and no adenopathy, tender in left anterior cervical chain  Lungs: clear to auscultation bilaterally  Heart: regular rate and rhythm, S1, S2 normal, no murmur, click, rub or gallop  Skin: Skin color, texture, turgor normal. No rashes or lesions    No results found for any visits on 02/24/15.    Assessment and Plan:  Raeli was seen today for ear pain.    Diagnoses and all orders for this visit:    Otalgia, left- probable sinusitis  -     amoxicillin (AMOXIL) 875 mg tablet; Take 1 Tab by mouth two (2) times a day.        Creta Levin, MD    There are no Patient Instructions on file for this visit.     ADDENDUM: HEALTH MAINTENANCE:  Health Maintenance   Topic Date Due   ??? Pneumococcal 19-64 Medium Risk (1 of 1 - PPSV23) 01/17/1989   ??? DTaP/Tdap/Td series (1 - Tdap) 01/18/1991   ??? PAP AKA CERVICAL CYTOLOGY  04/24/2016   ??? INFLUENZA AGE 59 TO ADULT  Completed

## 2015-05-02 ENCOUNTER — Encounter

## 2015-05-05 ENCOUNTER — Ambulatory Visit
Admit: 2015-05-05 | Discharge: 2015-05-05 | Payer: PRIVATE HEALTH INSURANCE | Attending: Acute Care | Primary: Family Medicine

## 2015-05-05 DIAGNOSIS — J984 Other disorders of lung: Secondary | ICD-10-CM

## 2015-05-05 LAB — AMB POC SPIROMETRY

## 2015-05-05 MED ORDER — FLUTICASONE 50 MCG/ACTUATION NASAL SPRAY, SUSP
50 mcg/actuation | NASAL | 6 refills | Status: DC
Start: 2015-05-05 — End: 2017-01-02

## 2015-05-05 NOTE — Patient Instructions (Signed)
She may decrease Qvar to 1 puff twice daily, continue to rinse mouth after use.  If she has worsening cough, develops shortness of breath or wheezing, she can resume 2 puffs twice daily.  May use albuterol inhaler 2 puffs 4 times daily as needed for wheezing, shortness of breath, worsening cough.  Add fluticasone 2 sprays each nostril once daily for better control of nasal drainage.  Continue Singulair and Zyrtec as prescribed.  Follow-up in 6 months with spirometry, sooner if needed.  If she is stable at that visit, will likely see on annual and as-needed basis.

## 2015-05-05 NOTE — Progress Notes (Signed)
Patient Name:  Sally [name]  Date of Birth:  20-Oct-1970    Office Visit 2015-05-20    CHIEF COMPLAINT:    Chief Complaint   Patient presents with   ??? Follow-up     small airways disease, cough, allergies       HISTORY OF PRESENT ILLNESS:    Since her last visit, she reports interval improvement in her respiratory status cough has resolved.  She denies wheezing.  She has rare episodes of shortness of breath.  She is compliant with Qvar 40 ??g, 2 puffs twice daily and is performing proper mouth care.  She has not required use of albuterol inhaler.  She has developed mild hoarseness.  She continues to have postnasal drainage despite taking Zyrtec and Singulair.    Past Medical History   Diagnosis Date   ??? Abnormal Pap smear    ??? Calculus of kidney    ??? Headache    ??? Thyroid disease        Problem List  Date Reviewed: 05/20/2015          Codes Class Noted    Small airways disease (Chronic) ICD-10-CM: J98.4  ICD-9-CM: 518.89  05/05/2014        Cough ICD-10-CM: R05  ICD-9-CM: 786.2  03/25/2014        Hx of seasonal allergies (Chronic) ICD-10-CM: Z88.9  ICD-9-CM: V15.09  03/25/2014        Screening for malignant neoplasm of the rectum ICD-10-CM: Z12.12  ICD-9-CM: V76.41  04/24/2013        Routine gynecological examination ICD-10-CM: Z01.419  ICD-9-CM: V72.31  04/24/2013        Irregular menses ICD-10-CM: N92.6  ICD-9-CM: 626.4  04/24/2013        Unspecified hypothyroidism ICD-10-CM: E03.9  ICD-9-CM: 244.9  02/05/2013        Migraine ICD-10-CM: G43.909  ICD-9-CM: 346.90  02/05/2013    Overview Addendum 02/05/2013 11:13 AM by Louie Bun, MD     DId try Topamax - got kidney stone             Allergic rhinitis due to pollen ICD-10-CM: J30.1  ICD-9-CM: 477.0  02/05/2013               Past Surgical History   Procedure Laterality Date   ??? Hx cholecystectomy     ??? Hx tonsillectomy     ??? Hx orthopaedic  2011     left plantar fasciitis surgery   ??? Hx orthopaedic  1999     left 3rd finger cyst removed       DIAGNOSTICS:  ??   Spirometry 03/2014 - Normal spirometry.?? Prior PFTs were suggestive of obstruction in the past.  CXR 03/2014 - no acute process.  Methacholine challenge 03/2014 -?? consistent with essentially a negative study, but smaller airways noted marked reversibility post bronchodilators.?? Please note FEF 25-75 improved by 720 mL from its lowest value during level 4, post bronchodilators  Spirometry 05-20-15???normal, significant interval improvement in FVC and FEV1.    Family History   Problem Relation Age of Onset   ??? Diabetes Maternal Grandmother    ??? Cancer Maternal Grandmother 7     lung cancer - non smoker   ??? Lung Disease Maternal Grandmother    ??? Diabetes Paternal Grandmother    ??? Diabetes Paternal Grandfather    ??? Cancer Maternal Grandfather      cirrhosis   ??? Hypertension Mother    ??? Thyroid Disease Mother    ???  Elevated Lipids Mother    ??? Gall Bladder Disease Mother    ??? Migraines Mother    ??? Diabetes Father        Social History     Social History   ??? Marital status: MARRIED     Spouse name: N/A   ??? Number of children: N/A   ??? Years of education: N/A     Social History Main Topics   ??? Smoking status: Never Smoker   ??? Smokeless tobacco: Never Used   ??? Alcohol use No   ??? Drug use: No   ??? Sexual activity: Yes     Partners: Male     Birth control/ protection: IUD     Other Topics Concern   ??? None     Social History Narrative    Lifelong nonsmoker.  Occasional EtOH use.  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  [place]  He has indoor dog.       Allergies   Allergen Reactions   ??? Codeine Nausea and Vomiting       Current Outpatient Prescriptions   Medication Sig   ??? amoxicillin (AMOXIL) 875 mg tablet Take 1 Tab by mouth two (2) times a day.   ??? levonorgestrel (MIRENA) 20 mcg/24 hr (5 years) IUD 1 Each by IntraUTERine route.   ??? beclomethasone (QVAR) 40 mcg/actuation inhaler 2 puffs bid, rinse mouth  after use.   ??? montelukast (SINGULAIR) 10 mg tablet 1 po q hs   ??? albuterol (PROAIR HFA) 90 mcg/actuation inhaler Take 2 Puffs by inhalation every four (4) hours as needed for Wheezing.   ??? levothyroxine (SYNTHROID) 100 mcg tablet Take 1 Tab by mouth Daily (before breakfast). (Patient taking differently: Take 88 mcg by mouth Daily (before breakfast).)   ??? cetirizine (ZYRTEC) 10 mg tablet Take  by mouth daily.   ??? rizatriptan (MAXALT-MLT) 10 mg disintegrating tablet Take 10 mg by mouth once as needed for Migraine.     No current facility-administered medications for this visit.        REVIEW OF SYSTEMS:         CONSTITUTIONAL:  There is no history of fever, chills, night sweats, weight loss, weight gain, persistent fatigue, or lethargy/hypersomnolence.    CARDIAC:  No chest pain, pressure, discomfort, palpitations, orthopnea, murmurs, or edema.     PHYSICAL EXAM:         Visit Vitals   ??? BP (!) 150/93   ??? Pulse 90   ??? Temp 97 ??F (36.1 ??C) (Oral)   ??? Resp 20   ??? Ht 5' 1.92" (1.573 m)   ??? Wt 201 lb (91.2 kg)   ??? SpO2 97%   ??? BMI 36.86 kg/m2       GENERAL APPEARANCE:  The patient is obese and in no respiratory distress.    HEENT:  PERRL.  Conjunctivae unremarkable.    Nasal mucosa is without epistaxis, or polyps but swollen and red with clear drainage. Posterior oropharynx is moist and pink, there is no significant narrowing of posterior oropharynx.  No thrush. + clear drainage present in posterior oropharynx.     LUNGS:  Normal respiratory effort with symmetrical lung expansion.  Lung sounds??? decreased slightly but completely clear.    HEART:  There is a regular rate and rhythm.  No murmur, rub, or gallop.  There is no edema in lower  extremities.     NEURO:  The patient is alert and oriented to person, place, and time.  Memory appears intact and mood is normal.  No gross sensorimotor deficits are present.    DIAGNOSTIC TESTS:    Studies were personally reviewed by me and discussed with the patient.        Spirometry: Spirometry is normal with significant interval improvement in FVC and FEV1.            ASSESSMENT:   (Medical Decision Making)                                                                                                                                          Encounter Diagnoses   Name Primary?   ??? Small airways disease Yes   ??? Cough    ??? Allergic rhinitis due to pollen, unspecified rhinitis seasonality    ??? Hoarseness      Significant improvement symptomatically and on spirometry.  Cough has resolved.  She has only rare episodes of shortness of breath.  She denies wheezing.  She has not required albuterol inhaler recently    She continues to have postnasal drainage and has significant clear drainage in the posterior oropharynx on exam.  She is compliant with Zyrtec will add nasal steroid spray.    Hoarseness is mild, may be secondary to inhaled steroid and/or persistent postnasal drainage.  Negative for reflux.      PLAN:     She may decrease Qvar to 1 puff twice daily, continue to rinse mouth after use.  If she has worsening cough, develops shortness of breath or wheezing, she can resume 2 puffs twice daily.  May use albuterol inhaler 2 puffs 4 times daily as needed for wheezing, shortness of breath, worsening cough.  Add fluticasone 2 sprays each nostril once daily for better control of nasal drainage.  Continue Singulair and Zyrtec as prescribed.  Follow-up in 6 months with spirometry, sooner if needed.  If she is stable at that visit, will likely see on annual and as-needed basis.    Orders Placed This Encounter   ??? AMB POC SPIROMETRY   ??? fluticasone (FLONASE) 50 mcg/actuation nasal spray     Sig: 2 sprays each nostril daily     Dispense:  1 Bottle     Refill:  6        Total  time spent with patient - 20 min.   Over 50% of today's office visit was spent in face to face time reviewing test results, prognosis, importance of compliance, education about disease  process, benefits of medications, instructions for management of acute symptoms, and follow up plans.     Supervising MD: Dr. Darcus Austin, NP    Electronically signed.  Dictated using voice recognition software.  Proof read but unrecognized errors may exist.

## 2015-05-23 MED ORDER — MONTELUKAST 10 MG TAB
10 mg | ORAL_TABLET | ORAL | 6 refills | Status: DC
Start: 2015-05-23 — End: 2015-11-02

## 2015-06-10 ENCOUNTER — Inpatient Hospital Stay: Admit: 2015-06-10 | Payer: PRIVATE HEALTH INSURANCE | Attending: Family Medicine | Primary: Family Medicine

## 2015-06-10 DIAGNOSIS — Z1231 Encounter for screening mammogram for malignant neoplasm of breast: Secondary | ICD-10-CM

## 2015-11-02 ENCOUNTER — Ambulatory Visit
Admit: 2015-11-02 | Discharge: 2015-11-02 | Payer: PRIVATE HEALTH INSURANCE | Attending: Acute Care | Primary: Family Medicine

## 2015-11-02 DIAGNOSIS — J984 Other disorders of lung: Secondary | ICD-10-CM

## 2015-11-02 LAB — AMB POC SPIROMETRY

## 2015-11-02 MED ORDER — MONTELUKAST 10 MG TAB
10 mg | ORAL_TABLET | ORAL | 3 refills | Status: DC
Start: 2015-11-02 — End: 2016-11-01

## 2015-11-02 MED ORDER — ALBUTEROL SULFATE HFA 90 MCG/ACTUATION AEROSOL INHALER
90 mcg/actuation | RESPIRATORY_TRACT | 11 refills | Status: DC | PRN
Start: 2015-11-02 — End: 2016-11-01

## 2015-11-02 MED ORDER — BECLOMETHASONE DIPROPIONATE 40 MCG/ACTUATION AEROSOL INHALER
40 mcg/actuation | Freq: Two times a day (BID) | RESPIRATORY_TRACT | 11 refills | Status: DC
Start: 2015-11-02 — End: 2016-01-09

## 2015-11-02 NOTE — Progress Notes (Signed)
Patient Name:  Sally [name]  Date of Birth:  Feb 27, 1971    Office Visit 11/02/2015    CHIEF COMPLAINT:    Chief Complaint   Patient presents with   ??? Follow-up     small airways disease, allergies, cough       HISTORY OF PRESENT ILLNESS:    Since her last visit, she reports overall stability in her respiratory status.  She has experienced one episode of shortness of breath which she associates with exposure.  This responded readily to albuterol inhaler.  She has not experienced cough or wheezing.  She is compliant with Qvar which we reduce to 1 puff twice daily at her last visit, Singulair, Flonase and Zyrtec.  Allergy symptoms are well controlled.    Past Medical History:   Diagnosis Date   ??? Abnormal Pap smear    ??? Calculus of kidney    ??? Headache    ??? Thyroid disease        Problem List  Date Reviewed: 05/05/2015          Codes Class Noted    Small airways disease (Chronic) ICD-10-CM: J98.4  ICD-9-CM: 518.89  05/05/2014        Cough ICD-10-CM: R05  ICD-9-CM: 786.2  03/25/2014        Hx of seasonal allergies (Chronic) ICD-10-CM: Z88.9  ICD-9-CM: V15.09  03/25/2014        Screening for malignant neoplasm of the rectum ICD-10-CM: Z12.12  ICD-9-CM: V76.41  04/24/2013        Routine gynecological examination ICD-10-CM: Z01.419  ICD-9-CM: V72.31  04/24/2013        Irregular menses ICD-10-CM: N92.6  ICD-9-CM: 626.4  04/24/2013        Unspecified hypothyroidism ICD-10-CM: E03.9  ICD-9-CM: 244.9  02/05/2013        Migraine ICD-10-CM: G43.909  ICD-9-CM: 346.90  02/05/2013    Overview Addendum 02/05/2013 11:13 AM by Louie BunJulie M Dangler, MD     DId try Topamax - got kidney stone             Allergic rhinitis due to pollen ICD-10-CM: J30.1  ICD-9-CM: 477.0  02/05/2013               Past Surgical History:   Procedure Laterality Date   ??? HX CHOLECYSTECTOMY     ??? HX ORTHOPAEDIC  2011    left plantar fasciitis surgery   ??? HX ORTHOPAEDIC  1999    left 3rd finger cyst removed   ??? HX TONSILLECTOMY         DIAGNOSTICS:  ????   Spirometry 03/2014 - Normal spirometry.?? Prior PFTs were suggestive of obstruction in the past.  CXR 03/2014 - no acute process.  Methacholine challenge 03/2014 -?? consistent with essentially a negative study, but smaller airways noted marked reversibility post bronchodilators.?? Please note FEF 25-75 improved by 720 mL from its lowest value during level 4, post bronchodilators  Spirometry 05/05/2015???normal, significant interval improvement in FVC and FEV1.  Spirometry 11/02/2015 - normal, interval decline.    Family History   Problem Relation Age of Onset   ??? Diabetes Maternal Grandmother    ??? Cancer Maternal Grandmother [id number]     lung cancer - non smoker   ??? Lung Disease Maternal Grandmother    ??? Diabetes Paternal Grandmother    ??? Diabetes Paternal Grandfather    ??? Cancer Maternal Grandfather      cirrhosis   ??? Hypertension Mother    ??? Thyroid Disease Mother    ???  Elevated Lipids Mother    ??? Gall Bladder Disease Mother    ??? Migraines Mother    ??? Diabetes Father        Social History     Social History   ??? Marital status: MARRIED     Spouse name: N/A   ??? Number of children: N/A   ??? Years of education: N/A     Social History Main Topics   ??? Smoking status: Never Smoker   ??? Smokeless tobacco: Never Used   ??? Alcohol use No   ??? Drug use: No   ??? Sexual activity: Yes     Partners: Male     Birth control/ protection: IUD     Other Topics Concern   ??? None     Social History Narrative    Lifelong nonsmoker.  Occasional EtOH use.  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  She has lived in [place].  He has indoor dog.       Allergies   Allergen Reactions   ??? Codeine Nausea and Vomiting       Current Outpatient Prescriptions   Medication Sig   ??? montelukast (SINGULAIR) 10 mg tablet TAKE 1 TABLET BY MOUTH EVERY NIGHT AT BEDTIME   ??? fluticasone (FLONASE) 50 mcg/actuation nasal spray 2 sprays each nostril daily    ??? levonorgestrel (MIRENA) 20 mcg/24 hr (5 years) IUD 1 Each by IntraUTERine route.   ??? beclomethasone (QVAR) 40 mcg/actuation inhaler 2 puffs bid, rinse mouth after use.   ??? albuterol (PROAIR HFA) 90 mcg/actuation inhaler Take 2 Puffs by inhalation every four (4) hours as needed for Wheezing.   ??? levothyroxine (SYNTHROID) 100 mcg tablet Take 1 Tab by mouth Daily (before breakfast). (Patient taking differently: Take 88 mcg by mouth Daily (before breakfast).)   ??? cetirizine (ZYRTEC) 10 mg tablet Take  by mouth daily.   ??? rizatriptan (MAXALT-MLT) 10 mg disintegrating tablet Take 10 mg by mouth once as needed for Migraine.     No current facility-administered medications for this visit.        REVIEW OF SYSTEMS:         CONSTITUTIONAL:  There is no history of fever, chills, night sweats, weight loss, weight gain, persistent fatigue, or lethargy/hypersomnolence.    CARDIAC:  No chest pain, pressure, discomfort, palpitations, orthopnea, murmurs, or edema.     PHYSICAL EXAM:         Visit Vitals   ??? BP 127/89   ??? Pulse 84   ??? Temp 97.2 ??F (36.2 ??C) (Oral)   ??? Resp 18   ??? Ht 5' 2.2" (1.58 m)   ??? Wt 194 lb 6.4 oz (88.2 kg)   ??? SpO2 98%   ??? BMI 35.33 kg/m2       GENERAL APPEARANCE:  The patient is over weight and in no respiratory distress.    HEENT:  PERRL.  Conjunctivae unremarkable. Posterior oropharynx is moist and pink, there is no significant narrowing of posterior oropharynx.  No thrush.     LUNGS:  Normal respiratory effort with symmetrical lung expansion.  Lung sounds???completely clear.    HEART:  There is a regular rate and rhythm.  No murmur, rub, or gallop.  There is no edema in lower extremities.     NEURO:  The patient is alert and oriented to person, place, and time.  Memory appears intact and mood is  normal.  No gross sensorimotor deficits are present.    DIAGNOSTIC TESTS:    Studies were personally reviewed by me and discussed with the patient.       Spirometry:         ASSESSMENT:   (Medical Decision Making)                                                                                                                                           Encounter Diagnoses   Name Primary?   ??? Small airways disease Yes   ??? Allergic rhinitis due to pollen, unspecified rhinitis seasonality    ??? Cough      She is stable symptomatically.  There has been some decline in FVC and FEV1 but still within normal range.  She has required albuterol inhaler on one occasion over the past 6 months.  Compliant with Qvar 1 puff twice daily.    Allergy symptoms are well controlled after adding Singulair and Flonase.  She was on Zyrtec initially before adding these.                      PLAN:     She will continue Qvar 1 puff twice daily but is advised to increase to 2 puffs twice daily if she has increased symptoms for albuterol inhaler.  She can attempt to hold Zyrtec and continue Singulair Flonase.  Follow-up in 1 year with spirometry, sooner if needed.    Orders Placed This Encounter   ??? AMB POC SPIROMETRY - 94010   ??? montelukast (SINGULAIR) 10 mg tablet   ??? beclomethasone (QVAR) 40 mcg/actuation aero   ??? albuterol (PROAIR HFA) 90 mcg/actuation inhaler          Total  time spent with patient -20 min.   Over 50% of today's office visit was spent in face to face time reviewing test results, prognosis, importance of compliance, education about disease process, benefits of medications, instructions for management of acute symptoms, and follow up plans.     Supervising MD: Dr. Berline Chough    Eliseo Gum, NP    Electronically signed.  Dictated using voice recognition software.  Proof read but unrecognized errors may exist.

## 2015-12-19 MED ORDER — MONTELUKAST 10 MG TAB
10 mg | ORAL_TABLET | ORAL | 11 refills | Status: DC
Start: 2015-12-19 — End: 2017-01-02

## 2016-01-09 ENCOUNTER — Ambulatory Visit
Admit: 2016-01-09 | Discharge: 2016-01-09 | Payer: PRIVATE HEALTH INSURANCE | Attending: Family Medicine | Primary: Family Medicine

## 2016-01-09 DIAGNOSIS — Z0000 Encounter for general adult medical examination without abnormal findings: Secondary | ICD-10-CM

## 2016-01-09 MED ORDER — RIZATRIPTAN 10 MG TAB, RAPID DISSOLVE
10 mg | ORAL_TABLET | Freq: Once | ORAL | 11 refills | Status: DC | PRN
Start: 2016-01-09 — End: 2017-01-02

## 2016-01-09 NOTE — Progress Notes (Signed)
[facility] [name]  [address]  [place], [place] [id number]  Phone: [phone] Fax [phone]  Wyvonne LenzJulie Janayla Marik, MD  01/09/2016           Sally [name]  is a 45 y.o.  year old  female patient who comes in for complete physical. She has been doing well. No fatigue, and has been working on weight loss and has joined [employer]. No shortness of breath, chest pain, palpitations. No abdominal pain, diarrhea, or constipation. No abnormal skin changes. Has  been working on diet and exercise. Feels well.      ROS:  Feeling well. No dyspnea or chest pain on exertion.  No abdominal pain, change in bowel habits, black or bloody stools.  No urinary tract symptoms. GYN ROS: no breast pain or new or enlarging lumps on self exam. Menopausal symptoms: none. No neurological complaints.  She does see gynecology about her Pap smears and breast exams.  She does see an asthma and allergy specialist who refills her asthma and allergy medicines.  She also sees dermatology.  She does need a refill on her Maxalt.  Her father was diagnosed with colon cancer in her 3570s and she is wondering about needing a colonoscopy.      Last PAP: 2015  Last Mammogram: 05/2015    Sally [name]  has  has a past medical history of Abnormal Pap smear; Calculus of kidney; Cough (03/25/2014); Headache; and Thyroid disease.    Sally [name]  has  has a past surgical history that includes cholecystectomy; tonsillectomy; orthopaedic (2011); and orthopaedic (1999).    [name] has a current medication list which includes the following prescription(s): minocycline, clindamycin, mometasone, rizatriptan, montelukast, albuterol, fluticasone, levonorgestrel, levothyroxine, cetirizine, and montelukast.    Current Outpatient Prescriptions   Medication Sig Dispense Refill   ??? minocycline (DYNACIN) 100 mg tablet Take 100 mg by mouth daily.     ??? clindamycin (CLEOCIN T) 1 % external solution Apply  to affected area  two (2) times a day. use thin film on affected area     ??? mometasone (ASMANEX TWISTHALER) 110 mcg (7 doses) aepb Take  by inhalation.     ??? rizatriptan (MAXALT-MLT) 10 mg disintegrating tablet Take 1 Tab by mouth once as needed for Migraine. 12 Tab 11   ??? montelukast (SINGULAIR) 10 mg tablet 1 po q hs 90 Tab 3   ??? albuterol (PROAIR HFA) 90 mcg/actuation inhaler Take 2 Puffs by inhalation every four (4) hours as needed for Wheezing. 1 Inhaler 11   ??? fluticasone (FLONASE) 50 mcg/actuation nasal spray 2 sprays each nostril daily 1 Bottle 6   ??? levonorgestrel (MIRENA) 20 mcg/24 hr (5 years) IUD 1 Each by IntraUTERine route.     ??? levothyroxine (SYNTHROID) 100 mcg tablet Take 1 Tab by mouth Daily (before breakfast). (Patient taking differently: Take 88 mcg by mouth Daily (before breakfast).) 90 Tab 3   ??? cetirizine (ZYRTEC) 10 mg tablet Take  by mouth daily.     ??? montelukast (SINGULAIR) 10 mg tablet 1 po q hs 30 Tab 11       Sally [name] family history includes Cancer in her maternal grandfather; Cancer (age of onset: 5787) in her maternal grandmother; Diabetes in her father, maternal grandmother, paternal grandfather, and paternal grandmother; Elevated Lipids in her mother; Merri RayGall Bladder Disease in her mother; Hypertension in her mother; Lung Disease in her maternal grandmother; Migraines in her mother; Thyroid Disease in her  mother.      [name]   Social History     Social History   ??? Marital status: MARRIED     Spouse name: N/A   ??? Number of children: N/A   ??? Years of education: N/A     Occupational History   ??? Not on file.     Social History Main Topics   ??? Smoking status: Never Smoker   ??? Smokeless tobacco: Never Used   ??? Alcohol use No   ??? Drug use: No   ??? Sexual activity: Yes     Partners: Male     Birth control/ protection: IUD     Other Topics Concern   ??? Not on file     Social History Narrative    Lifelong nonsmoker.  Occasional EtOH use.  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  [place]  He has indoor dog.         Sally [name]  has the following allergies:   Allergies   Allergen Reactions   ??? Codeine Nausea and Vomiting       Visit Vitals   ??? BP 120/80 (BP 1 Location: Left arm, BP Patient Position: Sitting)   ??? Pulse 84   ??? Temp 98.5 ??F (36.9 ??C) (Oral)   ??? Resp 20   ??? Ht 5\' 2"  (1.575 m)   ??? Wt 186 lb (84.4 kg)   ??? SpO2 99%  Comment: RA   ??? BMI 34.02 kg/m2       General: alert, oriented x 3, pleasant.  HEENT: Normocephalic, atraumatic, pupils equal and reactive to light. Tympanic membranes intact without erythema or edema. Oropharynx clear.   Neck: Supple, no masses or thyromegaly or lymphadenopathy.  Lungs: clear to auscultation bilaterally without wheezing or rhonchi.  CV: regular rate and rhythm, without murmurs, rubs, or gallops.  Abdomen: soft, nontender, nondistended, no masses. No hepatosplenomegaly. No abdominal bruits.No CVAT tenderness.  Ext: No lower extremity edema. No lesions about the feet.   Breasts: Deferred  Pelvic: Deferred  Skin: no lesions.      Diagnoses and all orders for this visit:    1. Routine general medical examination at a health care facility    2. Screen for colon cancer  -     COLONOSCOPY    3. Screening for cardiovascular condition  -     LIPID PANEL WITH LDL/HDL RATIO    4. Screening for diabetes mellitus  -     CMP    5. Migraine without aura and without status migrainosus, not intractable  -     rizatriptan (MAXALT-MLT) 10 mg disintegrating tablet; Take 1 Tab by mouth once as needed for Migraine.    6. Non morbid obesity due to excess calories    I have reviewed/discussed the above normal BMI with the patient.  I have recommended the following interventions: dietary management education, guidance, and counseling and encourage exercise . Marland Kitchen.      Louie BunJulie M Katerra Ingman, MD

## 2016-01-10 ENCOUNTER — Encounter: Admit: 2016-01-10 | Discharge: 2016-01-10 | Payer: PRIVATE HEALTH INSURANCE | Primary: Family Medicine

## 2016-01-12 LAB — CVD REPORT

## 2016-01-12 LAB — METABOLIC PANEL, COMPREHENSIVE
A-G Ratio: 1.4 (ref 1.2–2.2)
ALT (SGPT): 98 IU/L — ABNORMAL HIGH (ref 0–32)
AST (SGOT): 78 IU/L — ABNORMAL HIGH (ref 0–40)
Albumin: 4 g/dL (ref 3.5–5.5)
Alk. phosphatase: 140 IU/L — ABNORMAL HIGH (ref 39–117)
BUN/Creatinine ratio: 20 (ref 9–23)
BUN: 12 mg/dL (ref 6–24)
Bilirubin, total: 0.4 mg/dL (ref 0.0–1.2)
CO2: 24 mmol/L (ref 18–29)
Calcium: 9.4 mg/dL (ref 8.7–10.2)
Chloride: 103 mmol/L (ref 96–106)
Creatinine: 0.6 mg/dL (ref 0.57–1.00)
GFR est AA: 127 mL/min/{1.73_m2} (ref >59)
GFR est non-AA: 110 mL/min/{1.73_m2} (ref >59)
GLOBULIN, TOTAL: 2.8 g/dL (ref 1.5–4.5)
Glucose: 83 mg/dL (ref 65–99)
Potassium: 4.7 mmol/L (ref 3.5–5.2)
Protein, total: 6.8 g/dL (ref 6.0–8.5)
Sodium: 143 mmol/L (ref 134–144)

## 2016-01-12 LAB — LIPID PANEL WITH LDL/HDL RATIO
Cholesterol, total: 167 mg/dL (ref 100–199)
HDL Cholesterol: 38 mg/dL — ABNORMAL LOW (ref >39)
LDL, calculated: 105 mg/dL — ABNORMAL HIGH (ref 0–99)
LDL/HDL Ratio: 2.8 ratio units (ref 0.0–3.2)
Triglyceride: 121 mg/dL (ref 0–149)
VLDL, calculated: 24 mg/dL (ref 5–40)

## 2016-01-12 NOTE — Progress Notes (Signed)
pt. notified

## 2016-02-15 ENCOUNTER — Telehealth

## 2016-02-15 NOTE — Telephone Encounter (Signed)
Pt thinks she is due for labwork for her liver?   Last liver panel located 01-2013

## 2016-02-15 NOTE — Telephone Encounter (Signed)
pt. notified

## 2016-02-15 NOTE — Telephone Encounter (Signed)
Please have her come in for lab only.

## 2016-02-20 ENCOUNTER — Encounter

## 2016-02-20 ENCOUNTER — Other Ambulatory Visit: Admit: 2016-02-20 | Discharge: 2016-02-20 | Payer: PRIVATE HEALTH INSURANCE | Primary: Family Medicine

## 2016-02-20 DIAGNOSIS — Z23 Encounter for immunization: Secondary | ICD-10-CM

## 2016-02-21 LAB — HEPATIC FUNCTION PANEL
ALT (SGPT): 102 IU/L — ABNORMAL HIGH (ref 0–32)
AST (SGOT): 75 IU/L — ABNORMAL HIGH (ref 0–40)
Albumin: 4.2 g/dL (ref 3.5–5.5)
Alk. phosphatase: 126 IU/L — ABNORMAL HIGH (ref 39–117)
Bilirubin, direct: 0.12 mg/dL (ref 0.00–0.40)
Bilirubin, total: 0.3 mg/dL (ref 0.0–1.2)
Protein, total: 6.5 g/dL (ref 6.0–8.5)

## 2016-02-21 NOTE — Progress Notes (Signed)
Let patient know liver function tests are still elevated. Would like for her to go for a liver ultrasound. We will set up for her.

## 2016-02-21 NOTE — Progress Notes (Signed)
pt. notified

## 2016-02-23 NOTE — Telephone Encounter (Signed)
Pt asking to remind dr dangler that the minocycline could be the cause of the elevated lfts?  Having liver us 02-29-16

## 2016-02-27 NOTE — Telephone Encounter (Signed)
Let her know that it is possible that the minocycline could raise her LFT's. Still need her to go for the ultrasound. If it shows fatty liver we will monitor her labs. If it does not show fatty liver we will likely stop the minocycline for a month and see if it makes a difference.

## 2016-02-28 NOTE — Telephone Encounter (Signed)
pt. notified

## 2016-02-29 ENCOUNTER — Ambulatory Visit

## 2016-02-29 ENCOUNTER — Inpatient Hospital Stay: Admit: 2016-02-29 | Payer: PRIVATE HEALTH INSURANCE | Attending: Family Medicine | Primary: Family Medicine

## 2016-02-29 DIAGNOSIS — R7989 Other specified abnormal findings of blood chemistry: Secondary | ICD-10-CM

## 2016-02-29 NOTE — Progress Notes (Signed)
Let patient know ultrasound does not show any fatty liver or masses or problems. I would like for her to stop the acne medicine and come in in a month and let us recheck her liver tests. If they are normal we will know it was from the medicine. If they are not normal then we will need to do further investigation.

## 2016-03-01 NOTE — Progress Notes (Signed)
LMTCB

## 2016-03-02 NOTE — Progress Notes (Signed)
pt. notified

## 2016-04-10 ENCOUNTER — Other Ambulatory Visit: Admit: 2016-04-10 | Discharge: 2016-04-10 | Payer: PRIVATE HEALTH INSURANCE | Primary: Family Medicine

## 2016-04-10 DIAGNOSIS — R748 Abnormal levels of other serum enzymes: Secondary | ICD-10-CM

## 2016-04-11 LAB — HEPATIC FUNCTION PANEL
ALT (SGPT): 102 IU/L — ABNORMAL HIGH (ref 0–32)
AST (SGOT): 79 IU/L — ABNORMAL HIGH (ref 0–40)
Albumin: 4.1 g/dL (ref 3.5–5.5)
Alk. phosphatase: 120 IU/L — ABNORMAL HIGH (ref 39–117)
Bilirubin, direct: 0.15 mg/dL (ref 0.00–0.40)
Bilirubin, total: 0.4 mg/dL (ref 0.0–1.2)
Protein, total: 6.4 g/dL (ref 6.0–8.5)

## 2016-04-11 NOTE — Progress Notes (Signed)
Let patient know labs show her liver tests are about the same as one month ago. Would like to refer her to a gastroenterologist. Will make referral.

## 2016-04-11 NOTE — Progress Notes (Signed)
Pt notified and voiced understanding.  She agreed to a referral for gastro, Dr Bridget Hartshornangler has ordered the referral.

## 2016-05-15 ENCOUNTER — Encounter: Attending: Surgery | Primary: Family Medicine

## 2016-05-17 ENCOUNTER — Encounter

## 2016-05-24 ENCOUNTER — Encounter

## 2016-05-29 ENCOUNTER — Inpatient Hospital Stay: Admit: 2016-05-29 | Payer: PRIVATE HEALTH INSURANCE | Attending: Gastroenterology | Primary: Family Medicine

## 2016-05-29 DIAGNOSIS — R740 Nonspecific elevation of levels of transaminase and lactic acid dehydrogenase [LDH]: Secondary | ICD-10-CM

## 2016-05-29 MED ORDER — IBUPROFEN 600 MG TAB
600 mg | Freq: Four times a day (QID) | ORAL | Status: DC | PRN
Start: 2016-05-29 — End: 2016-06-02
  Administered 2016-05-29: 15:00:00 via ORAL

## 2016-05-29 MED ORDER — IBUPROFEN 200 MG TAB
200 mg | ORAL | Status: AC
Start: 2016-05-29 — End: ?

## 2016-05-29 MED ORDER — LIDOCAINE HCL 2 % (20 MG/ML) IJ SOLN
20 mg/mL (2 %) | Freq: Once | INTRAMUSCULAR | Status: AC
Start: 2016-05-29 — End: 2016-05-29
  Administered 2016-05-29: 13:00:00 via INTRADERMAL

## 2016-05-29 MED ORDER — MIDAZOLAM 1 MG/ML IJ SOLN
1 mg/mL | INTRAMUSCULAR | Status: AC
Start: 2016-05-29 — End: ?

## 2016-05-29 MED ORDER — DIPHENHYDRAMINE HCL 50 MG/ML IJ SOLN
50 mg/mL | Freq: Once | INTRAMUSCULAR | Status: AC
Start: 2016-05-29 — End: 2016-05-29
  Administered 2016-05-29: 13:00:00 via INTRAVENOUS

## 2016-05-29 MED ORDER — SODIUM CHLORIDE 0.9 % IV
INTRAVENOUS | Status: DC
Start: 2016-05-29 — End: 2016-06-02

## 2016-05-29 MED ORDER — HYDROCODONE-ACETAMINOPHEN 5 MG-325 MG TAB
5-325 mg | ORAL | Status: DC | PRN
Start: 2016-05-29 — End: 2016-06-02

## 2016-05-29 MED ORDER — SODIUM CHLORIDE 0.9 % IV
INTRAVENOUS | Status: DC
Start: 2016-05-29 — End: 2016-06-02
  Administered 2016-05-29: 13:00:00 via INTRAVENOUS

## 2016-05-29 MED ORDER — DIPHENHYDRAMINE HCL 50 MG/ML IJ SOLN
50 mg/mL | INTRAMUSCULAR | Status: AC
Start: 2016-05-29 — End: ?

## 2016-05-29 MED ORDER — FENTANYL CITRATE (PF) 50 MCG/ML IJ SOLN
50 mcg/mL | INTRAMUSCULAR | Status: DC | PRN
Start: 2016-05-29 — End: 2016-06-02
  Administered 2016-05-29 (×2): via INTRAVENOUS

## 2016-05-29 MED ORDER — SODIUM CHLORIDE 0.9 % IV
INTRAVENOUS | Status: AC
Start: 2016-05-29 — End: 2016-05-30

## 2016-05-29 MED ORDER — FENTANYL CITRATE (PF) 50 MCG/ML IJ SOLN
50 mcg/mL | INTRAMUSCULAR | Status: AC
Start: 2016-05-29 — End: ?

## 2016-05-29 MED ORDER — MIDAZOLAM 1 MG/ML IJ SOLN
1 mg/mL | INTRAMUSCULAR | Status: DC | PRN
Start: 2016-05-29 — End: 2016-06-02
  Administered 2016-05-29 (×2): via INTRAVENOUS

## 2016-05-29 MED ORDER — LIDOCAINE HCL 2 % (20 MG/ML) IJ SOLN
20 mg/mL (2 %) | INTRAMUSCULAR | Status: AC
Start: 2016-05-29 — End: ?

## 2016-05-29 MED FILL — MIDAZOLAM 1 MG/ML IJ SOLN: 1 mg/mL | INTRAMUSCULAR | Qty: 4

## 2016-05-29 MED FILL — IBUPROFEN 200 MG TAB: 200 mg | ORAL | Qty: 3

## 2016-05-29 MED FILL — FENTANYL CITRATE (PF) 50 MCG/ML IJ SOLN: 50 mcg/mL | INTRAMUSCULAR | Qty: 4

## 2016-05-29 MED FILL — XYLOCAINE 20 MG/ML (2 %) INJECTION SOLUTION: 20 mg/mL (2 %) | INTRAMUSCULAR | Qty: 20

## 2016-05-29 MED FILL — DIPHENHYDRAMINE HCL 50 MG/ML IJ SOLN: 50 mg/mL | INTRAMUSCULAR | Qty: 1

## 2016-05-29 NOTE — Progress Notes (Signed)
Recovery period without difficulty. Pt alert and oriented and denies pain. Dressing is clean, dry, and intact. Reviewed discharge instructions with patient and spouse, both verbalized understanding. Pt escorted to lobby discharge area via wheelchair. Vital signs and Aldrete score completed.

## 2016-05-29 NOTE — Progress Notes (Signed)
Patient back to IR recovery room 1; bedside [facility]. Family updated on status of care at bedside

## 2016-05-29 NOTE — Procedures (Signed)
Interventional Radiology Brief Procedure Note    Patient: Sally [name] MRN: [id number]  SSN: [id number]    Date of Birth: 04/19/1971  Age: 45 y.o.  Sex: female      Date of Procedure: 05/29/2016    Pre-Procedure Diagnosis: elevated LFTs    Post-Procedure Diagnosis: SAME    Procedure(s): Image Guided Biopsy    Brief Description of Procedure: Left liver lobe    Performed By: Yochanan Eddleman J Timisha Mondry, MD     Assistants: None    Anesthesia: Moderate Sedation    Estimated Blood Loss: Less than 10ml    Specimens: formalin    Implants: None    Findings: Unremarkable liver    Complications: None    Recommendations: 3 hour bedrest.       Follow Up: Dr Fyock    Signed By: Alexyss Balzarini J Cniyah Sproull, MD     May 29, 2016

## 2016-05-29 NOTE — Progress Notes (Signed)
Patient to US room for procedure. Patient awake and alert, verbalized name, DOB, and procedure to be performed. Patient moved to procedure table independently.

## 2016-05-29 NOTE — H&P (Signed)
Department of Interventional Radiology  [phone])    History and Physical    Patient:  Sally [name] MRN:  [id number]  SSN:  [id number]    Date of Birth:  01/15/1971  Age:  46 y.o.  Sex:  female      Primary Care Provider:  Louie BunJulie M Dangler, MD  Referring Physician:  Shon MilletFyock, Christopher J, MD    Subjective:     Chief Complaint: Elevated LFTs    History of the Present Illness:  The patient is a 46 y.o. female who presents with the above.      Past Medical History:   Diagnosis Date   ??? Abnormal Pap smear    ??? Calculus of kidney    ??? Cough 03/25/2014   ??? Headache    ??? Thyroid disease      Past Surgical History:   Procedure Laterality Date   ??? HX CHOLECYSTECTOMY     ??? HX ORTHOPAEDIC  2011    left plantar fasciitis surgery   ??? HX ORTHOPAEDIC  1999    left 3rd finger cyst removed   ??? HX TONSILLECTOMY          Review of Systems:    Pertinent items are noted in the History of Present Illness.    Current Outpatient Prescriptions   Medication Sig   ??? minocycline (DYNACIN) 100 mg tablet Take 100 mg by mouth daily.   ??? mometasone (ASMANEX TWISTHALER) 110 mcg (7 doses) aepb Take  by inhalation.   ??? montelukast (SINGULAIR) 10 mg tablet 1 po q hs   ??? fluticasone (FLONASE) 50 mcg/actuation nasal spray 2 sprays each nostril daily   ??? levonorgestrel (MIRENA) 20 mcg/24 hr (5 years) IUD 1 Each by IntraUTERine route.   ??? levothyroxine (SYNTHROID) 100 mcg tablet Take 1 Tab by mouth Daily (before breakfast). (Patient taking differently: Take 88 mcg by mouth Daily (before breakfast).)   ??? cetirizine (ZYRTEC) 10 mg tablet Take  by mouth daily.   ??? clindamycin (CLEOCIN T) 1 % external solution Apply  to affected area two (2) times a day. use thin film on affected area   ??? rizatriptan (MAXALT-MLT) 10 mg disintegrating tablet Take 1 Tab by mouth once as needed for Migraine.   ??? montelukast (SINGULAIR) 10 mg tablet 1 po q hs   ??? albuterol (PROAIR HFA) 90 mcg/actuation inhaler Take 2 Puffs by  inhalation every four (4) hours as needed for Wheezing.     Current Facility-Administered Medications   Medication Dose Route Frequency   ??? 0.9% sodium chloride infusion  25 mL/hr IntraVENous CONTINUOUS   ??? 0.9% sodium chloride infusion  25 mL/hr IntraVENous CONTINUOUS   ??? diphenhydrAMINE (BENADRYL) injection 12.5-50 mg  12.5-50 mg IntraVENous ONCE   ??? fentaNYL citrate (PF) injection 12.5-100 mcg  12.5-100 mcg IntraVENous Multiple   ??? midazolam (VERSED) injection 0.25-2 mg  0.25-2 mg IntraVENous Multiple   ??? lidocaine (XYLOCAINE) 20 mg/mL (2 %) injection 40-120 mg  40-120 mg IntraDERMal ONCE        Allergies   Allergen Reactions   ??? Codeine Nausea and Vomiting       Family History   Problem Relation Age of Onset   ??? Diabetes Maternal Grandmother    ??? Cancer Maternal Grandmother 3687     lung cancer - non smoker   ??? Lung Disease Maternal Grandmother    ??? Diabetes Paternal Grandmother    ??? Diabetes Paternal Grandfather    ??? Cancer Maternal Grandfather  cirrhosis   ??? Hypertension Mother    ??? Thyroid Disease Mother    ??? Elevated Lipids Mother    ??? Gall Bladder Disease Mother    ??? Migraines Mother    ??? Diabetes Father      Social History   Substance Use Topics   ??? Smoking status: Never Smoker   ??? Smokeless tobacco: Never Used   ??? Alcohol use No        Objective:       Physical Examination:    Vitals:    05/29/16 0723 05/29/16 0724   BP:  118/67   Pulse:  84   Resp:  19   Temp: 98.4 ??F (36.9 ??C)    SpO2:  99%   Weight: 72.6 kg (160 lb)    Height: 5\' 2"  (1.575 m)      Blood pressure 118/67, pulse 84, temperature 98.4 ??F (36.9 ??C), resp. rate 19, height 5\' 2"  (1.575 m), weight 72.6 kg (160 lb), SpO2 99 %, not currently breastfeeding.  General:  alert, cooperative, no distress  Heart:  regular rate and rhythm, S1, S2 normal, no murmur, click, rub or gallop  Lungs:  clear to auscultation bilaterally  Abdomen:  soft, nondistended, normal bowel sounds  Neuro:  normal without focal findings      Laboratory:     Lab Results    Component Value Date/Time    Sodium 143 01/10/2016 04:10 PM    Potassium 4.7 01/10/2016 04:10 PM    Chloride 103 01/10/2016 04:10 PM    CO2 24 01/10/2016 04:10 PM    Glucose 83 01/10/2016 04:10 PM    BUN 12 01/10/2016 04:10 PM    Creatinine 0.60 01/10/2016 04:10 PM    GFR est AA 127 01/10/2016 04:10 PM    GFR est non-AA 110 01/10/2016 04:10 PM    Calcium 9.4 01/10/2016 04:10 PM    Albumin 4.1 04/10/2016 04:44 PM    Albumin 4.2 02/20/2016 04:21 PM    Protein, total 6.4 04/10/2016 04:44 PM    Protein, total 6.5 02/20/2016 04:21 PM    A-G Ratio 1.4 01/10/2016 04:10 PM    AST (SGOT) 79 04/10/2016 04:44 PM    AST (SGOT) 75 02/20/2016 04:21 PM    ALT (SGPT) 102 04/10/2016 04:44 PM    ALT (SGPT) 102 02/20/2016 04:21 PM     No results found for: WBC, HGB, HCT, PLT, HGBEXT, HCTEXT, PLTEXT  No results found for: APTT, PTP, INR    Assessment:     Elevated LFTs.     Plan:     Planned Procedure:  Liver biopsy.  Sedation.      Risks, benefits, and alternatives reviewed with patient and she agrees to proceed with the procedure.      Signed By: Lottie Dawson, MD     May 29, 2016

## 2016-05-29 NOTE — Progress Notes (Signed)
IR Nurse Pre-Procedure Checklist Part 2          Consent signed: Yes    H&P complete:  Yes    Antibiotics: Not applicable    Airway/Mallampati Done: Yes    Shaved: Not applicable    Pregnancy Form:Yes    Patient Position: Yes    MD Side: Yes     Biopsy Worksheet: Yes    Specimen Medium: Yes

## 2016-05-29 NOTE — Progress Notes (Signed)
Prep complete

## 2016-05-29 NOTE — Procedures (Signed)
Interventional Radiology Brief Procedure Note    Patient: Sally DeisKelly Acton MRN: [id number]  SSN: [id number]    Date of Birth: [birth year]  Age: 46 y.o.  Sex: female      Date of Procedure: 05/29/2016    Pre-Procedure Diagnosis: elevated LFTs    Post-Procedure Diagnosis: SAME    Procedure(s): Image Guided Biopsy    Brief Description of Procedure: Left liver lobe    Performed By: Lottie DawsonMichael J Yaman Grauberger, MD     Assistants: None    Anesthesia: Moderate Sedation    Estimated Blood Loss: Less than 10ml    Specimens: formalin    Implants: None    Findings: Unremarkable liver    Complications: None    Recommendations: 3 hour bedrest.       Follow Up: Dr Salley HewsFyock    Signed By: Lottie DawsonMichael J Jimena Wieczorek, MD     May 29, 2016

## 2016-08-03 ENCOUNTER — Ambulatory Visit

## 2016-08-03 ENCOUNTER — Inpatient Hospital Stay: Admit: 2016-08-03 | Payer: PRIVATE HEALTH INSURANCE | Attending: Family Medicine | Primary: Family Medicine

## 2016-08-03 DIAGNOSIS — Z1231 Encounter for screening mammogram for malignant neoplasm of breast: Secondary | ICD-10-CM

## 2016-10-15 MED ORDER — HYDROCORTISONE 2.5 % RECTAL CREAM
2.5 % | Freq: Four times a day (QID) | CUTANEOUS | 0 refills | Status: DC
Start: 2016-10-15 — End: 2017-01-02

## 2016-10-15 NOTE — Telephone Encounter (Signed)
From: Sally DeisKelly Labelle  To: Louie BunJulie M Dangler, MD  Sent: 10/15/2016 7:06 AM EDT  Subject:  Non-Urgent Medical Question    Good  Morning Dr. Bridget Hartshornangler,  I  am having issues with hemorrhoids. It's been going on for over 2 weeks now and I have tried over the counter medications...but nothing really seems to be helping. Is there something you can prescribe that is stronger and will be helpful?  Thanks,  Sally DeisKelly  [name] [phone]

## 2016-11-01 ENCOUNTER — Ambulatory Visit
Admit: 2016-11-01 | Discharge: 2016-11-01 | Payer: PRIVATE HEALTH INSURANCE | Attending: Acute Care | Primary: Family Medicine

## 2016-11-01 DIAGNOSIS — J984 Other disorders of lung: Secondary | ICD-10-CM

## 2016-11-01 LAB — AMB POC SPIROMETRY

## 2016-11-01 MED ORDER — MONTELUKAST 10 MG TAB
10 mg | ORAL_TABLET | ORAL | 11 refills | Status: DC
Start: 2016-11-01 — End: 2017-01-02

## 2016-11-01 MED ORDER — BECLOMETHASONE DIPROPIONATE 40 MCG/ACTUATION AEROSOL INHALER
40 mcg/actuation | RESPIRATORY_TRACT | 11 refills | Status: DC
Start: 2016-11-01 — End: 2017-01-02

## 2016-11-01 NOTE — Progress Notes (Signed)
[facility]  [name]  [name], [place] [id number]  [phone]    Patient Name:  Sally RiggKelly Porter Latella    Date of Birth:  1970-10-24    Office Visit 11/01/2016      CHIEF COMPLAINT:      Chief Complaint   Patient presents with   ??? Follow-up     Small airways disease, allergic rhinitis, cough       HISTORY OF PRESENT ILLNESS:      Since her last visit, she reports overall stability in her respiratory status.  In fact, states that she feels great.  She currently denies any shortness of breath, wheezing or cough.  Previously was taking Qvar 1 inhalation once daily.  Was on vacation last week and forgot to take it and has not experienced any increased symptoms.  Last took albuterol inhaler in October of last year.  Allergy symptoms are currently controlled with Singulair and Flonase.  Reports that fall seems to be worst time of year for her.    She is on weight watchers and reports that she has lost a total of 48 pounds.      Past Medical History:   Diagnosis Date   ??? Abnormal Pap smear    ??? Calculus of kidney    ??? Cough 03/25/2014   ??? Headache    ??? 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  ICD-9-CM: 518.89  05/05/2014        Hx of seasonal allergies (Chronic) ICD-10-CM: Z88.9  ICD-9-CM: V15.09  03/25/2014        Screening for malignant neoplasm of the rectum ICD-10-CM: Z12.12  ICD-9-CM: V76.41  04/24/2013        Routine gynecological examination ICD-10-CM: Z01.419  ICD-9-CM: V72.31  04/24/2013        Irregular menses ICD-10-CM: N92.6  ICD-9-CM: 626.4  04/24/2013        Unspecified hypothyroidism ICD-10-CM: E03.9  ICD-9-CM: 244.9  02/05/2013        Migraine ICD-10-CM: G43.909  ICD-9-CM: 346.90  02/05/2013    Overview Addendum 02/05/2013 11:13 AM by Louie BunJulie M Dangler, MD     DId try Topamax - got kidney stone             Allergic rhinitis due to pollen ICD-10-CM: J30.1  ICD-9-CM: 477.0  02/05/2013               Past Surgical History:   Procedure Laterality Date   ??? HX CHOLECYSTECTOMY     ??? HX ORTHOPAEDIC  2011    left plantar fasciitis surgery   ??? HX ORTHOPAEDIC  1999    left 3rd finger cyst removed   ??? HX TONSILLECTOMY         DIAGNOSTICS:      Spirometry 03/2014 - Normal spirometry.?? Prior PFTs were suggestive of obstruction in the past.  CXR 03/2014 - no acute process.  Methacholine challenge 03/2014 -?? consistent with essentially a negative study, but smaller airways noted marked reversibility post bronchodilators.?? Please note FEF 25-75 improved by 720 mL from its lowest value during level 4, post bronchodilators  Spirometry 05/05/2015???normal, significant interval improvement in FVC and FEV1.  Spirometry 11/02/2015 - normal, interval decline.  Spirometry 11/01/2016???normal.  Slight but statistically insignificant improvement in FVC.  No significant change in FEV1.  Social History     Social History   ??? Marital status: MARRIED     Spouse name: N/A   ??? Number of children: N/A   ??? Years of education: N/A     Social History Main Topics   ??? Smoking status: Never Smoker   ??? Smokeless tobacco: Never Used   ??? Alcohol use No   ??? Drug use: No   ??? Sexual activity: Yes     Partners: Male     Birth control/ protection: IUD     Other Topics Concern   ??? None     Social History Narrative    Lifelong nonsmoker.  Occasional EtOH use.  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  She has lived in [place].  He has indoor dog.       Family History   Problem Relation Age of Onset   ??? Diabetes Maternal Grandmother    ??? Cancer Maternal Grandmother 60     lung cancer - non smoker   ??? Lung Disease Maternal Grandmother    ??? Diabetes Paternal Grandmother    ??? Diabetes Paternal Grandfather    ??? Cancer Maternal Grandfather      cirrhosis   ??? Hypertension Mother    ??? Thyroid Disease Mother    ??? Elevated Lipids Mother     ??? Gall Bladder Disease Mother    ??? Migraines Mother    ??? Diabetes Father        Allergies   Allergen Reactions   ??? Codeine Nausea and Vomiting       Current Outpatient Prescriptions   Medication Sig   ??? beclomethasone (QVAR) 40 mcg/actuation aero Take 1 Puff by inhalation daily.   ??? albuterol (VENTOLIN HFA) 90 mcg/actuation inhaler Take 2 Puffs by inhalation every four (4) hours as needed for Wheezing.   ??? clindamycin (CLEOCIN T) 1 % external solution Apply  to affected area two (2) times a day. use thin film on affected area   ??? rizatriptan (MAXALT-MLT) 10 mg disintegrating tablet Take 1 Tab by mouth once as needed for Migraine.   ??? montelukast (SINGULAIR) 10 mg tablet 1 po q hs   ??? montelukast (SINGULAIR) 10 mg tablet 1 po q hs   ??? fluticasone (FLONASE) 50 mcg/actuation nasal spray 2 sprays each nostril daily   ??? levonorgestrel (MIRENA) 20 mcg/24 hr (5 years) IUD 1 Each by IntraUTERine route.   ??? levothyroxine (SYNTHROID) 100 mcg tablet Take 1 Tab by mouth Daily (before breakfast). (Patient taking differently: Take 75 mcg by mouth Daily (before breakfast).)   ??? cetirizine (ZYRTEC) 10 mg tablet Take  by mouth daily.   ??? hydrocortisone (ANUSOL-HC) 2.5 % rectal cream Insert  into rectum four (4) times daily.     No current facility-administered medications for this visit.          REVIEW OF SYSTEMS:    CONSTITUTIONAL:  There is no history of fever, chills, night sweats,  weight gain, persistent fatigue, or lethargy/hypersomnolence.    CARDIAC:   No chest pain, pressure, discomfort, palpitations, orthopnea, murmurs, or edema.    GI:  No dysphagia, heartburn reflux, nausea/vomiting, diarrhea, abdominal pain, or bleeding.    NEURO:   There is no history of AMS, persistent headache, decreased level of consciousness, seizures, or motor or sensory deficits.      PHYSICAL EXAM:    Visit Vitals   ???  BP 110/70 (BP 1 Location: Left arm, BP Patient Position: Sitting)   ??? Pulse 94   ??? Temp 99.1 ??F (37.3 ??C)   ??? Resp 20    ??? Ht 5\' 2"  (1.575 m)   ??? Wt 160 lb (72.6 kg)   ??? SpO2 99%   ??? BMI 29.26 kg/m2        GENERAL APPEARANCE:  The patient is normal weight and in no respiratory distress.    HEENT:  PERRL.  Conjunctivae unremarkable. Posterior oropharynx is moist, pink, no exudate or lesions.      NECK/LYMPHATIC:   Symmetrical with no elevation of jugular venous pulsation.  Trachea midline. No thyroid enlargement.  No cervical adenopathy.    LUNGS:   Normal respiratory effort with symmetrical lung expansion.   Breath sounds??? slightly decreased but completely clear.Marland Kitchen    HEART:   There is a regular rate and rhythm.  No murmur, rub, or gallop.  There is no edema in the lower extremities.    ABDOMEN:  Soft and non-tender.  No hepatosplenomegaly.  Bowel sounds are normal.      NEURO:  The patient is alert and oriented to person, place, and time.  Memory appears intact and mood is normal.  No gross sensorimotor deficits are present.    DIAGNOSTIC TESTS: Studies were personally reviewed by me and discussed with the patient.    Spirometry:         ASSESSMENT:   (Medical Decision Making)                                                                                                                                          Encounter Diagnoses   Name Primary?   ??? Small airways disease Yes   ??? Allergic rhinitis due to pollen, unspecified seasonality    ??? Cough      She is stable symptomatically.  She has been off of Qvar completely for approximately 1 week.  Previously was taking 1 inhalation once daily.  She has not required albuterol since October of last year.  Allergy symptoms are currently well controlled with Singulair and Flonase.  Currently denies any issues with cough.                        PLAN:     She may hold Qvar as long as she remains stable but is advised to resume 1 puff once or twice daily, up to 2 puffs twice daily if she has recurrence of cough, develops wheezing or shortness of breath or requires albuterol  inhaler on a frequent basis.  Continue Singulair 10 mg at bedtime.  Commended in weight loss.  Follow-up in 1 year with spirometry, sooner if needed.      Orders Placed This Encounter   ??? AMB POC SPIROMETRY   ??? beclomethasone (QVAR) 40 mcg/actuation aero  Sig: 1 - 2 puffs bid, rinse mouth after use.     Dispense:  1 Inhaler     Refill:  11   ??? montelukast (SINGULAIR) 10 mg tablet     Sig: 1 po q hs     Dispense:  30 Tab     Refill:  11         Follow-up Disposition:  Return in about 1 year (around 11/01/2017) for MD or Franky Macho, small airways disease, allergic rhinitis, cough, w/ PFT's.     Eliseo Gum, NP    Total  time spent with patient -  min.   Over 50% of today's office visit was spent in face to face time reviewing test results, prognosis, importance of compliance, education about disease process, benefits of medications, instructions for management of acute symptoms, and follow up plans.     Supervising MD: Dr.     Electronically signed. Dictated using voice recognition software.  Proof read but unrecognized errors may exist.

## 2016-11-01 NOTE — Patient Instructions (Signed)
She may hold Qvar as long as she remains stable but is advised to resume 1 puff once or twice daily, up to 2 puffs twice daily if she has recurrence of cough, develops wheezing or shortness of breath or requires albuterol inhaler on a frequent basis.  Continue Singulair 10 mg at bedtime.  Commended in weight loss.  Follow-up in 1 year with spirometry, sooner if needed.

## 2016-12-12 ENCOUNTER — Encounter: Attending: Family Medicine | Primary: Family Medicine

## 2017-01-02 ENCOUNTER — Ambulatory Visit
Admit: 2017-01-02 | Discharge: 2017-01-02 | Payer: PRIVATE HEALTH INSURANCE | Attending: Family Medicine | Primary: Family Medicine

## 2017-01-02 DIAGNOSIS — Z0000 Encounter for general adult medical examination without abnormal findings: Secondary | ICD-10-CM

## 2017-01-02 MED ORDER — FLUTICASONE 50 MCG/ACTUATION NASAL SPRAY, SUSP
50 mcg/actuation | NASAL | 6 refills | Status: AC
Start: 2017-01-02 — End: ?

## 2017-01-02 MED ORDER — RIZATRIPTAN 10 MG TAB, RAPID DISSOLVE
10 mg | ORAL_TABLET | Freq: Once | ORAL | 11 refills | Status: DC | PRN
Start: 2017-01-02 — End: 2019-03-02

## 2017-01-02 MED ORDER — ALBUTEROL SULFATE HFA 90 MCG/ACTUATION AEROSOL INHALER
90 mcg/actuation | RESPIRATORY_TRACT | 11 refills | Status: DC | PRN
Start: 2017-01-02 — End: 2017-11-01

## 2017-01-02 MED ORDER — MONTELUKAST 10 MG TAB
10 mg | ORAL_TABLET | ORAL | 11 refills | Status: DC
Start: 2017-01-02 — End: 2017-11-01

## 2017-01-02 NOTE — Progress Notes (Signed)
[facility] [name]  [address]  [place], [place] [address]  Phone: [phone] Fax [phone]  Sally Lenz, MD          S[name]  is a 46 y.o.  year old  female patient who comes in for complete physical. She has been doing well. No fatigue, and has worked on weight loss thru [employer] and has lost over 40 pounds.  No shortness of breath, chest pain, palpitations. No abdominal pain, diarrhea, or constipation. No abnormal skin changes. Has  been working on diet and exercise. Feels well.  Her GYN manages her thyroid.    ROS:  Feeling well. No dyspnea or chest pain on exertion.  No abdominal pain, change in bowel habits, black or bloody stools.  No urinary tract symptoms. GYN ROS: no breast pain or new or enlarging lumps on self exam. Menopausal symptoms: none. No neurological complaints.    She does see gynecology about her Pap smears and breast exams.  She does see an asthma and allergy specialist who refills her asthma and allergy medicines.  She also sees dermatology.  She does need a refill on her Maxalt.    ??  Last PAP: 5/18  Last Mammogram:2018    [name]  has  has a past medical history of Abnormal Pap smear; Calculus of kidney; Cough (03/25/2014); Headache; and Thyroid disease.    [place] [name]  has  has a past surgical history that includes hx cholecystectomy; hx tonsillectomy; hx orthopaedic (2011); and hx orthopaedic (1999).    [name] has a current medication list which includes the following prescription(s): rizatriptan, montelukast, fluticasone, albuterol, clindamycin, levonorgestrel, levothyroxine, and cetirizine.  Current Outpatient Prescriptions   Medication Sig Dispense Refill   ??? rizatriptan (MAXALT-MLT) 10 mg disintegrating tablet Take 1 Tab by mouth once as needed for Migraine for up to 1 dose. 12 Tab 11   ??? montelukast (SINGULAIR) 10 mg tablet 1 po q hs 30 Tab 11   ??? fluticasone (FLONASE) 50 mcg/actuation nasal spray 2 sprays each nostril  daily 1 Bottle 6   ??? albuterol (VENTOLIN HFA) 90 mcg/actuation inhaler Take 2 Puffs by inhalation every four (4) hours as needed for Wheezing. 1 Inhaler 11   ??? clindamycin (CLEOCIN T) 1 % external solution Apply  to affected area two (2) times a day. use thin film on affected area     ??? levonorgestrel (MIRENA) 20 mcg/24 hr (5 years) IUD 1 Each by IntraUTERine route.     ??? levothyroxine (SYNTHROID) 100 mcg tablet Take 1 Tab by mouth Daily (before breakfast). (Patient taking differently: Take 75 mcg by mouth Daily (before breakfast).) 90 Tab 3   ??? cetirizine (ZYRTEC) 10 mg tablet Take  by mouth daily.         [name] family history includes Cancer in her maternal grandfather; Cancer (age of onset: 38) in her maternal grandmother; Diabetes in her father, maternal grandmother, paternal grandfather, and paternal grandmother; Elevated Lipids in her mother; Merri Ray Bladder Disease in her mother; Hypertension in her mother; Lung Disease in her maternal grandmother; Migraines in her mother; Thyroid Disease in her mother.      [name]   Social History     Social History   ??? Marital status: MARRIED     Spouse name: N/A   ??? Number of children: N/A   ??? Years of education: N/A     Occupational History   ??? Not on  file.     Social History Main Topics   ??? Smoking status: Never Smoker   ??? Smokeless tobacco: Never Used   ??? Alcohol use No   ??? Drug use: No   ??? Sexual activity: Yes     Partners: Male     Birth control/ protection: IUD     Other Topics Concern   ??? Not on file     Social History Narrative    Lifelong nonsmoker.  Occasional EtOH use.  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  She has lived in [place].  He has indoor dog.         [name]  has the following allergies:   Allergies   Allergen Reactions   ??? Codeine Nausea and Vomiting       Visit Vitals    ??? BP 110/70 (BP 1 Location: Left arm, BP Patient Position: Sitting)   ??? Pulse 77   ??? Temp 98.8 ??F (37.1 ??C) (Oral)   ??? Resp 16   ??? Ht  (1.575 m)   ??? Wt 162 lb 6.4 oz (73.7 kg)   ??? SpO2 98%  Comment: room air sitting   ??? BMI 29.7 kg/m2     Waist is 37 inches.  General: alert, oriented x 3, pleasant.  HEENT: Normocephalic, atraumatic, pupils equal and reactive to light. Tympanic membranes intact without erythema or edema. Oropharynx clear.   Neck: Supple, no masses or thyromegaly or lymphadenopathy.  Lungs: clear to auscultation bilaterally without wheezing or rhonchi.  CV: regular rate and rhythm, without murmurs, rubs, or gallops.  Abdomen: soft, nontender, nondistended, no masses. No hepatosplenomegaly. No abdominal bruits.No CVAT tenderness.  Ext: No lower extremity edema. No lesions about the feet.   Breasts: no masses, no nipple discharge, no axillary lymphadenopathy of either breast.  Pelvic: normal female external genitalia, pelvic deferred.  Skin: no lesions.      Diagnoses and all orders for this visit:    1. Routine general medical examination at a health care facility    2. Screening, anemia, deficiency, iron  -     CBC WITH AUTOMATED DIFF; Future    3. Screening for cardiovascular condition  -     LIPID PANEL WITH LDL/HDL RATIO; Future    4. Screening for diabetes mellitus  -     GLUCOSE, RANDOM; Future    5. Screening for breast cancer  -     MAM MAMMO BI SCREENING DIGTL (routine screening, yearly, no symptoms); Future    6. Migraine without aura and without status migrainosus, not intractable  -     rizatriptan (MAXALT-MLT) 10 mg disintegrating tablet; Take 1 Tab by mouth once as needed for Migraine for up to 1 dose.    7. Encounter for immunization  -     TETANUS, DIPHTHERIA, ACELLULAR PERTUSSIS (TDAP) (54098)  -     INFLUENZA VIRUS VACCINE QUADRIVALENT, PRESERVATIVE FREE SYRINGE (11914)    8. Elevated LFTs    9. Small airways disease  -     montelukast (SINGULAIR) 10 mg tablet; 1 po q hs   -     albuterol (VENTOLIN HFA) 90 mcg/actuation inhaler; Take 2 Puffs by inhalation every four (4) hours as needed for Wheezing.    10. Allergic rhinitis due to pollen, unspecified seasonality  -     fluticasone (FLONASE) 50 mcg/actuation nasal spray; 2 sprays each nostril  daily      Follow up with Gastroenterology.    Louie BunJulie M Reice Bienvenue, MD            Discussed the patient's BMI with her.  The BMI follow up plan is as follows:     dietary management education, guidance, and counseling  encourage exercise  monitor weight  prescribed dietary intake    An After Visit Summary was printed and given to the patient.

## 2017-01-02 NOTE — Patient Instructions (Addendum)
Learning About Physical Activity  What is physical activity?    Physical activity is any kind of activity that gets your body moving.  The types of physical activity that can help you get fit and stay healthy include:  ?? Aerobic or "cardio" activities that make your heart beat faster and make you breathe harder, such as brisk walking, riding a bike, or running. Aerobic activities strengthen your heart and lungs and build up your endurance.  ?? Strength training activities that make your muscles work against, or "resist," something, such as lifting weights or doing push-ups. These activities help tone and strengthen your muscles.  ?? Stretches that allow you to move your joints and muscles through their full range of motion. Stretching helps you be more flexible and avoid injury.  What are the benefits of physical activity?  Being active is one of the best things you can do to get fit and stay healthy. It helps you to:  ?? Feel stronger and have more energy to do all the things you like to do.  ?? Focus better at school or work and perform better in sports.  ?? Feel, think, and sleep better.  ?? Reach and stay at a healthy weight.  ?? Lose fat and build lean muscle.  ?? Lower your risk for serious health problems.  ?? Keep your bones, muscles, and joints strong.  Being fit lets you do more physical activity. And it lets you work out harder without as much effort.  How can you make physical activity part of your life?  Get at least 30 minutes of exercise on most days of the week. Walking is a good choice. You also may want to do other activities, such as running, swimming, cycling, or playing tennis or team sports.  Pick activities that you like-ones that make your heart beat faster, your muscles stronger, and your muscles and joints more flexible. If you find more than one thing you like doing, do them all. You don't have to do the same thing every day.   Get your heart pumping every day. Any activity that makes your heart beat faster and keeps it at that rate for a while counts.  Here are some great ways to get your heart beating faster:  ?? Go for a brisk walk, run, or bike ride.  ?? Go for a hike or swim.  ?? Go in-line skating.  ?? Play a game of touch football, basketball, or soccer.  ?? Ride a bike.  ?? Play tennis or racquetball.  ?? Climb stairs.  Even some household chores can be aerobic-just do them at a faster pace. Vacuuming, raking or mowing the lawn, sweeping the garage, and washing and waxing the car all can help get your heart rate up.  Strengthen your muscles during the week. You don't have to lift heavy weights or grow big, bulky muscles to get stronger. Doing a few simple activities that make your muscles work against, or "resist," something can help you get stronger.  For example, you can:  ?? Do push-ups or sit-ups, which use your own body weight as resistance.  ?? Lift weights or dumbbells or use stretch bands at home or in a gym or community center.  Stretch your muscles often. Stretching will help you as you become more active. It can help you stay flexible, loosen tight muscles, and avoid injury. It can also help improve your balance and posture and can be a great way to relax.  Be sure   to stretch the muscles you'll be using when you work out. It's best to warm your muscles slightly before you stretch them. Walk or do some other light aerobic activity for a few minutes, and then start stretching.  When you stretch your muscles:  ?? Do it slowly. Stretching is not about going fast or making sudden movements.  ?? Don't push or bounce during a stretch.  ?? Hold each stretch for at least 15 to 30 seconds, if you can. You should feel a stretch in the muscle, but not pain.  ?? Breathe out as you do the stretch. Then breathe in as you hold the stretch. Don't hold your breath.  If you're worried about how more activity might affect your health, have a  checkup before you start. Follow any special advice your doctor gives you for getting a smart start.  Where can you learn more?  Go to http://www.healthwise.net/GoodHelpConnections.  Enter W332 in the search box to learn more about "Learning About Physical Activity."  Current as of: March 29, 2016  Content Version: 11.7  ?? 2006-2018 [employer]. Care instructions adapted under license by [employer] (which disclaims liability or warranty for this information). If you have questions about a medical condition or this instruction, always ask your healthcare professional. Healthwise, Incorporated disclaims any warranty or liability for your use of this information.           Body Mass Index: Care Instructions  Your Care Instructions    Body mass index (BMI) can help you see if your weight is raising your risk for health problems. It uses a formula to compare how much you weigh with how tall you are.  ?? A BMI lower than 18.5 is considered underweight.  ?? A BMI between 18.5 and 24.9 is considered healthy.  ?? A BMI between 25 and 29.9 is considered overweight. A BMI of 30 or higher is considered obese.  If your BMI is in the normal range, it means that you have a lower risk for weight-related health problems. If your BMI is in the overweight or obese range, you may be at increased risk for weight-related health problems, such as high blood pressure, heart disease, stroke, arthritis or joint pain, and diabetes. If your BMI is in the underweight range, you may be at increased risk for health problems such as fatigue, lower protection (immunity) against illness, muscle loss, bone loss, hair loss, and hormone problems.  BMI is just one measure of your risk for weight-related health problems. You may be at higher risk for health problems if you are not active, you eat an unhealthy diet, or you drink too much alcohol or use tobacco products.   Follow-up care is a key part of your treatment and safety. Be sure to make and go to all appointments, and call your doctor if you are having problems. It's also a good idea to know your test results and keep a list of the medicines you take.  How can you care for yourself at home?  ?? Practice healthy eating habits. This includes eating plenty of fruits, vegetables, whole grains, lean protein, and low-fat dairy.  ?? If your doctor recommends it, get more exercise. Walking is a good choice. Bit by bit, increase the amount you walk every day. Try for at least 30 minutes on most days of the week.  ?? Do not smoke. Smoking can increase your risk for health problems. If you need help quitting, talk to your doctor about   stop-smoking programs and medicines. These can increase your chances of quitting for good.  ?? Limit alcohol to 2 drinks a day for men and 1 drink a day for women. Too much alcohol can cause health problems.  If you have a BMI higher than 25  ?? Your doctor may do other tests to check your risk for weight-related health problems. This may include measuring the distance around your waist. A waist measurement of more than 40 inches in men or 35 inches in women can increase the risk of weight-related health problems.  ?? Talk with your doctor about steps you can take to stay healthy or improve your health. You may need to make lifestyle changes to lose weight and stay healthy, such as changing your diet and getting regular exercise.  If you have a BMI lower than 18.5  ?? Your doctor may do other tests to check your risk for health problems.  ?? Talk with your doctor about steps you can take to stay healthy or improve your health. You may need to make lifestyle changes to gain or maintain weight and stay healthy, such as getting more healthy foods in your diet and doing exercises to build muscle.  Where can you learn more?  Go to http://www.healthwise.net/GoodHelpConnections.   Enter S176 in the search box to learn more about "Body Mass Index: Care Instructions."  Current as of: February 03, 2015  Content Version: 11.4  ?? 2006-2017 [employer], [employer]. Care instructions adapted under license by [employer] (which disclaims liability or warranty for this information). If you have questions about a medical condition or this instruction, always ask your healthcare professional. Healthwise, Incorporated disclaims any warranty or liability for your use of this information.

## 2017-01-02 NOTE — ACP (Advance Care Planning) (Signed)
Pt has ACP will bring to next visit

## 2017-01-02 NOTE — ACP (Advance Care Planning) (Signed)
Pt has ACP will bring to next visit

## 2017-01-03 ENCOUNTER — Other Ambulatory Visit: Admit: 2017-01-03 | Discharge: 2017-01-03 | Payer: PRIVATE HEALTH INSURANCE | Primary: Family Medicine

## 2017-01-03 DIAGNOSIS — Z136 Encounter for screening for cardiovascular disorders: Secondary | ICD-10-CM

## 2017-01-04 LAB — CBC WITH AUTOMATED DIFF
ABS. BASOPHILS: 0.1 10*3/uL (ref 0.0–0.2)
ABS. EOSINOPHILS: 0.7 10*3/uL — ABNORMAL HIGH (ref 0.0–0.4)
ABS. IMM. GRANS.: 0 10*3/uL (ref 0.0–0.1)
ABS. MONOCYTES: 0.6 10*3/uL (ref 0.1–0.9)
ABS. NEUTROPHILS: 2.9 10*3/uL (ref 1.4–7.0)
Abs Lymphocytes: 1.8 10*3/uL (ref 0.7–3.1)
BASOPHILS: 1 %
EOSINOPHILS: 12 %
HCT: 41.9 % (ref 34.0–46.6)
HGB: 13.9 g/dL (ref 11.1–15.9)
IMMATURE GRANULOCYTES: 0 %
Lymphocytes: 30 %
MCH: 32.5 pg (ref 26.6–33.0)
MCHC: 33.2 g/dL (ref 31.5–35.7)
MCV: 98 fL — ABNORMAL HIGH (ref 79–97)
MONOCYTES: 9 %
NEUTROPHILS: 48 %
PLATELET: 295 10*3/uL (ref 150–379)
RBC: 4.28 x10E6/uL (ref 3.77–5.28)
RDW: 13.3 % (ref 12.3–15.4)
WBC: 6.1 10*3/uL (ref 3.4–10.8)

## 2017-01-04 LAB — CVD REPORT

## 2017-01-04 LAB — LIPID PANEL WITH LDL/HDL RATIO
Cholesterol, total: 155 mg/dL (ref 100–199)
HDL Cholesterol: 45 mg/dL (ref >39)
LDL, calculated: 92 mg/dL (ref 0–99)
LDL/HDL Ratio: 2 ratio (ref 0.0–3.2)
Triglyceride: 90 mg/dL (ref 0–149)
VLDL, calculated: 18 mg/dL (ref 5–40)

## 2017-01-04 LAB — GLUCOSE, RANDOM: Glucose: 95 mg/dL (ref 65–99)

## 2017-01-04 NOTE — Progress Notes (Signed)
No additional comment

## 2017-01-04 NOTE — Progress Notes (Signed)
Pt informed of lab results via mychart

## 2017-01-04 NOTE — Progress Notes (Signed)
Let patient know labs are normal/good.

## 2017-07-24 NOTE — Progress Notes (Signed)
Left message to call for appointment in July with Sally StandardLuke [name].        Return in about 1 year (around 11/01/2017) for MD or [name], small airways disease, allergic rhinitis, cough, w/ PFT's.

## 2017-08-30 ENCOUNTER — Inpatient Hospital Stay: Admit: 2017-08-30 | Payer: PRIVATE HEALTH INSURANCE | Attending: Family Medicine | Primary: Family Medicine

## 2017-08-30 ENCOUNTER — Ambulatory Visit: Payer: PRIVATE HEALTH INSURANCE | Primary: Family Medicine

## 2017-08-30 DIAGNOSIS — Z1231 Encounter for screening mammogram for malignant neoplasm of breast: Secondary | ICD-10-CM

## 2017-11-01 ENCOUNTER — Ambulatory Visit: Attending: Acute Care | Primary: Family Medicine

## 2017-11-01 ENCOUNTER — Ambulatory Visit
Admit: 2017-11-01 | Discharge: 2017-11-01 | Payer: PRIVATE HEALTH INSURANCE | Attending: Acute Care | Primary: Family Medicine

## 2017-11-01 DIAGNOSIS — J984 Other disorders of lung: Secondary | ICD-10-CM

## 2017-11-01 LAB — AMB POC SPIROMETRY

## 2017-11-01 MED ORDER — ALBUTEROL SULFATE HFA 90 MCG/ACTUATION AEROSOL INHALER
90 mcg/actuation | RESPIRATORY_TRACT | 11 refills | Status: DC
Start: 2017-11-01 — End: 2019-10-02

## 2017-11-01 MED ORDER — MONTELUKAST 10 MG TAB
10 mg | ORAL_TABLET | ORAL | 11 refills | Status: DC
Start: 2017-11-01 — End: 2018-11-14

## 2017-11-01 NOTE — Patient Instructions (Signed)
May use albuterol inhaler 2 puffs 4 times daily if needed for shortness of breath, wheezing, cough.  If she requires this 4 times daily greater than 2 days in 1 week  Flu vaccine in the fall.  We will not plan schedule follow-up but will be happy to see her in the future if needed for respiratory concerns.  She is commended in maintaining substantial weight loss.

## 2017-11-01 NOTE — Progress Notes (Signed)
[facility]  [address]  [place], [place] [address]  [phone]    Patient Name:  Sally [name]    Date of Birth:  1970/10/01    Office Visit November 04, 2017      CHIEF COMPLAINT:      Chief Complaint   Patient presents with   ??? Follow-up     small airways disease   ??? Cough   ??? Allergic Rhinitis       HISTORY OF PRESENT ILLNESS:      She is a 47 year old female seen today for follow-up of small airways disease, allergic rhinitis, cough.  Since her last visit, she has remained stable from a respiratory standpoint.  Currently denies any shortness of breath, wheezing or cough.  She has not required albuterol inhaler over the past year.  She has been off of inhaled steroid for at least 1 year.  Cough has remained stable with management of allergy symptoms.  Compliant with Singulair, antihistamine and nasal spray.  She has been able to maintain 60 pound weight loss 2016.    Past Medical History:   Diagnosis Date   ??? Abnormal Pap smear    ??? Calculus of kidney    ??? Cough 03/25/2014   ??? Headache    ??? Thyroid disease        Problem List  Date Reviewed: 11/04/2017          Codes Class Noted    Elevated LFTs ICD-10-CM: R94.5  ICD-9-CM: 790.6  01/02/2017    Overview Signed 01/02/2017  4:38 PM by Louie Bun, MD     Liver biopsy normal; idiopathic; follow with GI regularly.             Small airways disease (Chronic) ICD-10-CM: J98.4  ICD-9-CM: 518.89  05/05/2014        Cough ICD-10-CM: R05  ICD-9-CM: 786.2  03/25/2014        Hx of seasonal allergies (Chronic) ICD-10-CM: Z88.9  ICD-9-CM: V15.09  03/25/2014        Screening for malignant neoplasm of the rectum ICD-10-CM: Z12.12  ICD-9-CM: V76.41  04/24/2013        Routine gynecological examination ICD-10-CM: Z01.419  ICD-9-CM: V72.31  04/24/2013        Irregular menses ICD-10-CM: N92.6  ICD-9-CM: 626.4  04/24/2013        Unspecified hypothyroidism ICD-10-CM: E03.9  ICD-9-CM: 244.9  02/05/2013        Migraine ICD-10-CM: G43.909   ICD-9-CM: 346.90  02/05/2013    Overview Addendum 02/05/2013 11:13 AM by Louie Bun, MD     DId try Topamax - got kidney stone             Allergic rhinitis due to pollen ICD-10-CM: J30.1  ICD-9-CM: 477.0  02/05/2013              Past Surgical History:   Procedure Laterality Date   ??? HX CHOLECYSTECTOMY     ??? HX ORTHOPAEDIC  2011    left plantar fasciitis surgery   ??? HX ORTHOPAEDIC  1999    left 3rd finger cyst removed   ??? HX TONSILLECTOMY         DIAGNOSTICS:      Spirometry 03/2014 - Normal spirometry.?? Prior PFTs were suggestive of obstruction in the past.  CXR 03/2014 - no acute process.  Methacholine challenge 03/2014 -?? consistent with essentially a negative study, but smaller airways noted marked reversibility post bronchodilators.?? Please note FEF  25-75 improved by 720 mL from its lowest value during level 4, post bronchodilators  Spirometry 05/05/2015???normal, significant interval improvement in FVC and FEV1.  Spirometry 11/02/2015 - normal, interval decline.  Spirometry 11/01/2016???normal.  Slight but statistically insignificant improvement in FVC.  No significant change in FEV1.  Spirometry 11/01/2017???normal.    Social History     Socioeconomic History   ??? Marital status: MARRIED     Spouse name: Not on file   ??? Number of children: Not on file   ??? Years of education: Not on file   ??? Highest education level: Not on file   Tobacco Use   ??? Smoking status: Never Smoker   ??? Smokeless tobacco: Never Used   Substance and Sexual Activity   ??? Alcohol use: No   ??? Drug use: No   ??? Sexual activity: Yes     Partners: Male     Birth control/protection: IUD   Social History Narrative    Lifelong nonsmoker.  Occasional EtOH use.  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  [place]  He has indoor dog.       Family History   Problem Relation Age of Onset   ??? Diabetes Maternal Grandmother     ??? Cancer Maternal Grandmother [id number]        lung cancer - non smoker   ??? Lung Disease Maternal Grandmother    ??? Diabetes Paternal Grandmother    ??? Diabetes Paternal Grandfather    ??? Cancer Maternal Grandfather         cirrhosis   ??? Hypertension Mother    ??? Thyroid Disease Mother    ??? Elevated Lipids Mother    ??? Gall Bladder Disease Mother    ??? Migraines Mother    ??? Diabetes Father        Allergies   Allergen Reactions   ??? Codeine Nausea and Vomiting       Current Outpatient Medications   Medication Sig   ??? levothyroxine (SYNTHROID) 50 mcg tablet Take 50 mcg by mouth. Alternate with .75 mcg   ??? rizatriptan (MAXALT) 10 mg tablet Take 10 mg by mouth once as needed for Migraine. May repeat in 2 hours if needed   ??? montelukast (SINGULAIR) 10 mg tablet 1 po q hs   ??? fluticasone (FLONASE) 50 mcg/actuation nasal spray 2 sprays each nostril daily   ??? albuterol (VENTOLIN HFA) 90 mcg/actuation inhaler Take 2 Puffs by inhalation every four (4) hours as needed for Wheezing.   ??? clindamycin (CLEOCIN T) 1 % external solution Apply  to affected area two (2) times a day. use thin film on affected area   ??? levonorgestrel (MIRENA) 20 mcg/24 hr (5 years) IUD 1 Each by IntraUTERine route.   ??? levothyroxine (SYNTHROID) 100 mcg tablet Take 1 Tab by mouth Daily (before breakfast). (Patient taking differently: Take 75 mcg by mouth Daily (before breakfast).)   ??? cetirizine (ZYRTEC) 10 mg tablet Take  by mouth daily.     No current facility-administered medications for this visit.        REVIEW OF SYSTEMS:    Review of Systems   Constitutional: Negative for chills, diaphoresis, fever, malaise/fatigue and weight loss.   Cardiovascular: Negative for chest pain, palpitations, claudication, leg swelling and PND.   Gastrointestinal: Negative for abdominal pain, constipation, diarrhea, heartburn, nausea and vomiting.   Neurological: Negative for  dizziness, tremors, seizures, weakness and headaches.         PHYSICAL EXAM:    Vitals:    11/01/17 1524    BP: 116/70   BP 1 Location: Right arm   BP Patient Position: Sitting   Pulse: 77   Resp: 16   Temp: 98.9 ??F (37.2 ??C)   TempSrc: Comment: forehead   SpO2: 99%  Comment: r/a   Weight: 160 lb (72.6 kg)   Height: 5\' 2"  (1.575 m)     Body mass index is 29.26 kg/m??.    GENERAL APPEARANCE:  The patient is normal weight and in no respiratory distress.    HEENT:  PERRL.  Conjunctivae unremarkable.    NECK/LYMPHATIC:   Symmetrical with no elevation of jugular venous pulsation.  Trachea midline. No thyroid enlargement.  No cervical adenopathy.    LUNGS:   Normal respiratory effort with symmetrical lung expansion.   Breath sounds??? completely clear.Marland Kitchen    HEART:   There is a regular rate and rhythm.  No murmur, rub, or gallop.  There is no edema in the lower extremities.    ABDOMEN:  Soft and non-tender.  No hepatosplenomegaly.  Bowel sounds are normal.      NEURO:  The patient is alert and oriented to person, place, and time.  Memory appears intact and mood is normal.  No gross sensorimotor deficits are present.    DIAGNOSTIC TESTS: Studies were personally reviewed by me and discussed with the patient.      CXR PA and lateral:    Results for orders placed during the hospital encounter of 03/25/14   XR CHEST PA LAT    Narrative Chest PA and lateral views    Indication: [facility] patient history of asthma unspecified asthma severity  uncomplicated for 10 years. Chronic cough for 2 months.    Findings: No prior studies for comparison. Lungs are clear and normally expanded  with normal cardiomediastinal silhouette. No pleural or acute gross chest wall  abnormalities. The gallbladder is surgically absent.      Impression Impression: No acute abnormality.         Spirometry: 11/01/2017          ASSESSMENT:   (Medical Decision Making)                                                                                                                                          Encounter Diagnoses   Name Primary?   ??? Small airways disease Yes    ??? Allergic rhinitis due to pollen, unspecified seasonality    ??? Cough      She is stable symptomatically.  Spirometry is normal.  She has not required albuterol inhaler in greater than 1 year.  Has been off of inhaled steroid for at least one year.    Cough has improved with management of allergy symptoms, compliant with Singulair,  antihistamine and nasal spray.                        PLAN:     May use albuterol inhaler 2 puffs 4 times daily if needed for shortness of breath, wheezing, cough.  If she requires this 4 times daily greater than 2 days in 1 week  Flu vaccine in the fall.  We will not plan schedule follow-up but will be happy to see her in the future if needed for respiratory concerns.  She is commended in maintaining substantial weight loss.      Orders Placed This Encounter   ??? AMB POC SPIROMETRY   ??? montelukast (SINGULAIR) 10 mg tablet     Sig: 1 po q hs     Dispense:  30 Tab     Refill:  11   ??? albuterol (PROVENTIL HFA, VENTOLIN HFA, PROAIR HFA) 90 mcg/actuation inhaler     Sig: 2 puffs qid prn     Dispense:  1 Inhaler     Refill:  11           Eliseo Gum, NP    Total  time spent with patient -20 min.   Over 50% of today's office visit was spent in face to face time reviewing test results, prognosis, importance of compliance, education about disease process, benefits of medications, instructions for management of acute symptoms, and follow up plans.     Collaborating MD: Dr. Suzie Portela    Electronically signed. Dictated using voice recognition software.  Proof read but unrecognized errors may exist.

## 2017-11-01 NOTE — Progress Notes (Deleted)
She is a 47 year old female seen today for follow-up of small airways disease, allergic rhinitis, cough.    Spirometry 03/2014 - Normal spirometry.?? Prior PFTs were suggestive of obstruction in the past.  CXR 03/2014 - no acute process.  Methacholine challenge 03/2014 -?? consistent with essentially a negative study, but smaller airways noted marked reversibility post bronchodilators.?? Please note FEF 25-75 improved by 720 mL from its lowest value during level 4, post bronchodilators  Spirometry 05/05/2015???normal, significant interval improvement in FVC and FEV1.  Spirometry 11/02/2015 - normal, interval decline.  Spirometry 11/01/2016???normal.  Slight but statistically insignificant improvement in FVC.  No significant change in FEV1.  ??

## 2017-11-01 NOTE — Progress Notes (Signed)
Progress Notes by Eliseo GumMyers, Shamus Desantis P, NP at 11/01/17 1540                Author: Eliseo GumMyers, Spenser Harren P, NP  Service: --  Author Type: Nurse Practitioner       Filed: 11/01/17 1614  Encounter Date: 11/01/2017  Status: Signed          Editor: Eliseo GumMyers, Ioana Louks P, NP (Nurse Practitioner)               [facility]   [name]   [place], [place] [address]   [phone]      Patient Name:  Sally [name]      Date of Birth:  August 06, 1970      Office Visit 11/01/2017        CHIEF COMPLAINT:          Chief Complaint       Patient presents with        ?  Follow-up             small airways disease        ?  Cough        ?  Allergic Rhinitis           HISTORY OF PRESENT ILLNESS:        She is a 47 year old female seen today for follow-up of small airways disease, allergic rhinitis, cough.  Since her last visit, she has remained stable from a respiratory standpoint.  Currently denies any shortness of breath, wheezing or cough.  She has  not required albuterol inhaler over the past year.  She has been off of inhaled steroid for at least 1 year.  Cough has remained stable with management of allergy symptoms.  Compliant with Singulair, antihistamine and nasal spray.  She has been able to  maintain 60 pound weight loss 2016.        Past Medical History:        Diagnosis  Date         ?  Abnormal Pap smear       ?  Calculus of kidney       ?  Cough  03/25/2014     ?  Headache           ?  Thyroid disease                Problem List   Date Reviewed:  11/01/2017                        Codes  Class  Noted             Elevated LFTs  ICD-10-CM: R94.5   ICD-9-CM: 790.6    01/02/2017          Overview Signed 01/02/2017  4:38 PM by Louie Bunangler, Julie M, MD            Liver biopsy normal; idiopathic; follow with GI regularly.                                   Small airways disease (Chronic)  ICD-10-CM: J98.4   ICD-9-CM: 518.89    05/05/2014                       Cough  ICD-10-CM: R05   ICD-9-CM: 786.2     03/25/2014  Hx of seasonal allergies (Chronic)  ICD-10-CM: Z88.9   ICD-9-CM: V15.09    03/25/2014                       Screening for malignant neoplasm of the rectum  ICD-10-CM: Z12.12   ICD-9-CM: V76.41    04/24/2013                       Routine gynecological examination  ICD-10-CM: Z01.419   ICD-9-CM: V72.31    04/24/2013                       Irregular menses  ICD-10-CM: N92.6   ICD-9-CM: 626.4    04/24/2013                       Unspecified hypothyroidism  ICD-10-CM: E03.9   ICD-9-CM: 244.9    02/05/2013                       Migraine  ICD-10-CM: G43.909   ICD-9-CM: 346.90    02/05/2013          Overview Addendum 02/05/2013 11:13 AM by Louie Bun, MD            DId try Topamax - got kidney stone                                   Allergic rhinitis due to pollen  ICD-10-CM: J30.1   ICD-9-CM: 477.0    02/05/2013                            Past Surgical History:         Procedure  Laterality  Date          ?  HX CHOLECYSTECTOMY         ?  HX ORTHOPAEDIC    2011          left plantar fasciitis surgery          ?  HX ORTHOPAEDIC    1999          left 3rd finger cyst removed          ?  HX TONSILLECTOMY               DIAGNOSTICS:        Spirometry 03/2014 - Normal spirometry.?? Prior PFTs were suggestive of obstruction in the past.   CXR 03/2014 - no acute process.   Methacholine challenge 03/2014 -?? consistent with essentially a negative study, but smaller airways noted marked reversibility post bronchodilators.?? Please note FEF 25-75 improved by 720 mL from its lowest value during level 4, post bronchodilators   Spirometry 05/05/2015-normal, significant interval improvement in FVC and FEV1.   Spirometry 11/02/2015 - normal, interval decline.   Spirometry 11/01/2016-normal.  Slight but statistically insignificant improvement in FVC.  No significant change in FEV1.   Spirometry 11/01/2017-normal.        Social History          Socioeconomic History         ?  Marital status:  MARRIED              Spouse  name:  Not on file         ?  Number of  children:  Not on file     ?  Years of education:  Not on file     ?  Highest education level:  Not on file       Tobacco Use         ?  Smoking status:  Never Smoker     ?  Smokeless tobacco:  Never Used       Substance and Sexual Activity         ?  Alcohol use:  No     ?  Drug use:  No     ?  Sexual activity:  Yes              Partners:  Male         Birth control/protection:  IUD       Social History Narrative          Lifelong nonsmoker.  Occasional EtOH use.  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  She has lived in [place].  He has indoor dog.             Family History         Problem  Relation  Age of Onset          ?  Diabetes  Maternal Grandmother       ?  Cancer  Maternal Grandmother  33              lung cancer - non smoker          ?  Lung Disease  Maternal Grandmother       ?  Diabetes  Paternal Grandmother       ?  Diabetes  Paternal Grandfather       ?  Cancer  Maternal Grandfather                cirrhosis          ?  Hypertension  Mother       ?  Thyroid Disease  Mother       ?  Elevated Lipids  Mother       ?  Gall Bladder Disease  Mother       ?  Migraines  Mother            ?  Diabetes  Father               Allergies        Allergen  Reactions         ?  Codeine  Nausea and Vomiting             Current Outpatient Medications        Medication  Sig         ?  levothyroxine (SYNTHROID) 50 mcg tablet  Take 50 mcg by mouth. Alternate with .75 mcg     ?  rizatriptan (MAXALT) 10 mg tablet  Take 10 mg by mouth once as needed for Migraine. May repeat in 2 hours if needed     ?  montelukast (SINGULAIR) 10 mg tablet  1 po q hs     ?  fluticasone (FLONASE) 50 mcg/actuation nasal spray  2 sprays each nostril daily     ?  albuterol (VENTOLIN HFA) 90 mcg/actuation inhaler  Take 2 Puffs by inhalation every four (4) hours as needed for Wheezing.     ?  clindamycin  (CLEOCIN T) 1 % external solution  Apply  to affected area two (2) times a day. use thin film on affected area     ?  levonorgestrel (MIRENA) 20 mcg/24 hr (5 years) IUD  1 Each by IntraUTERine route.     ?  levothyroxine (SYNTHROID) 100 mcg tablet  Take 1 Tab by mouth Daily (before breakfast). (Patient taking differently: Take 75 mcg by mouth Daily (before breakfast).)         ?  cetirizine (ZYRTEC) 10 mg tablet  Take  by mouth daily.          No current facility-administered medications for this visit.               REVIEW OF SYSTEMS:      Review of Systems    Constitutional: Negative for chills, diaphoresis, fever, malaise/fatigue and weight loss.    Cardiovascular: Negative for chest pain, palpitations, claudication, leg swelling and PND.    Gastrointestinal: Negative for abdominal pain, constipation, diarrhea, heartburn, nausea and vomiting.    Neurological: Negative for dizziness, tremors, seizures, weakness and headaches.             PHYSICAL EXAM:        Vitals:          11/01/17 1524        BP:  116/70     BP 1 Location:  Right arm     BP Patient Position:  Sitting     Pulse:  77     Resp:  16     Temp:  98.9 ??F (37.2 ??C)     TempSrc:  Comment: forehead     SpO2:  99%   Comment: r/a     Weight:  160 lb (72.6 kg)        Height:  5\' 2"  (1.575 m)        Body mass index is 29.26 kg/m??.         GENERAL APPEARANCE:   The patient is normal weight and in no respiratory distress.      HEENT:   PERRL.  Conjunctivae unremarkable.      NECK/LYMPHATIC:    Symmetrical with no elevation of jugular venous pulsation.  Trachea midline. No thyroid enlargement.  No cervical adenopathy.      LUNGS:    Normal respiratory effort with symmetrical lung expansion.   Breath sounds- completely clear.Marland Kitchen      HEART:    There is a regular rate and rhythm.  No murmur, rub, or gallop.  There is no edema in the lower extremities.      ABDOMEN:   Soft and non-tender.  No hepatosplenomegaly.  Bowel sounds are normal.        NEURO:   The patient  is alert and oriented to person, place, and time.  Memory appears intact and mood is normal.  No gross sensorimotor deficits are present.      DIAGNOSTIC TESTS: Studies were personally reviewed by me and discussed with the patient.         CXR PA and lateral:       Results for orders placed during the hospital encounter of 03/25/14     XR CHEST PA LAT           Narrative  Chest PA and lateral views      Indication: [facility] patient history of asthma unspecified asthma severity   uncomplicated for 10 years. Chronic cough for 2 months.  Findings: No prior studies for comparison. Lungs are clear and normally expanded   with normal cardiomediastinal silhouette. No pleural or acute gross chest wall   abnormalities. The gallbladder is surgically absent.              Impression  Impression: No acute abnormality.              Spirometry: 11/01/2017                 ASSESSMENT:   (Medical Decision Making)                                                                                                                                              Encounter Diagnoses        Name  Primary?         ?  Small airways disease  Yes     ?  Allergic rhinitis due to pollen, unspecified seasonality           ?  Cough          She is stable symptomatically.  Spirometry is normal.  She has not required albuterol inhaler in greater than 1 year.  Has been off of inhaled steroid for at least one year.      Cough has improved with management of allergy symptoms, compliant with Singulair, antihistamine and nasal spray.                           PLAN:       May use albuterol inhaler 2 puffs 4 times daily if needed for shortness of breath, wheezing, cough.  If she requires this 4 times daily greater than 2 days in 1 week   Flu vaccine in the fall.  We will not plan schedule follow-up but will be happy to see her in the future if needed for respiratory concerns.   She is commended in maintaining substantial weight loss.           Orders Placed  This Encounter        ?  AMB POC SPIROMETRY     ?  montelukast (SINGULAIR) 10 mg tablet             Sig: 1 po q hs         Dispense:  30 Tab         Refill:  11        ?  albuterol (PROVENTIL HFA, VENTOLIN HFA, PROAIR HFA) 90 mcg/actuation inhaler             Sig: 2 puffs qid prn         Dispense:  1 Inhaler             Refill:  11  Eliseo Gum, NP      Total  time spent with patient -20 min.    Over 50% of today's office visit was spent in face to face time reviewing test results, prognosis, importance of compliance, education about disease process, benefits of medications, instructions for management of acute symptoms, and follow up plans.       Collaborating MD: Dr. Suzie Portela      Electronically signed. Dictated using voice recognition software.  Proof read but unrecognized errors may exist.

## 2018-01-29 ENCOUNTER — Other Ambulatory Visit: Admit: 2018-01-29 | Discharge: 2018-01-29 | Payer: PRIVATE HEALTH INSURANCE | Primary: Family Medicine

## 2018-01-29 DIAGNOSIS — Z23 Encounter for immunization: Secondary | ICD-10-CM

## 2018-01-29 NOTE — Progress Notes (Signed)
Pt here for flu shot. Pt states that she has no allergies.  Injected in right deltoid.

## 2018-01-29 NOTE — Progress Notes (Signed)
Pt here for flu shot. Pt states that she has no allergies.  Injected in right deltoid.

## 2018-04-22 ENCOUNTER — Ambulatory Visit: Attending: Family Medicine | Primary: Family Medicine

## 2018-04-22 ENCOUNTER — Ambulatory Visit
Admit: 2018-04-22 | Discharge: 2018-04-22 | Payer: PRIVATE HEALTH INSURANCE | Attending: Family Medicine | Primary: Family Medicine

## 2018-04-22 DIAGNOSIS — IMO0001 Reserved for inherently not codable concepts without codable children: Secondary | ICD-10-CM

## 2018-04-22 DIAGNOSIS — J101 Influenza due to other identified influenza virus with other respiratory manifestations: Secondary | ICD-10-CM

## 2018-04-22 LAB — AMB POC RAPID INFLUENZA TEST
Influenza A Ag POC: POSITIVE Pos/Neg
Influenza A Antigen, POC: POSITIVE Pos/Neg
Influenza B Ag POC: NEGATIVE Pos/Neg
Influenza B Antigen, POC: NEGATIVE Pos/Neg

## 2018-04-22 MED ORDER — OSELTAMIVIR PHOSPHATE 75 MG CAP
75 mg | ORAL_CAPSULE | Freq: Two times a day (BID) | ORAL | 0 refills | Status: AC
Start: 2018-04-22 — End: 2018-04-27

## 2018-04-22 MED ORDER — BENZONATATE 100 MG CAP
100 mg | ORAL_CAPSULE | Freq: Three times a day (TID) | ORAL | 0 refills | Status: AC | PRN
Start: 2018-04-22 — End: 2018-04-29

## 2018-04-22 MED ORDER — PROMETHAZINE 25 MG TAB
25 mg | ORAL_TABLET | ORAL | 0 refills | Status: DC | PRN
Start: 2018-04-22 — End: 2019-10-02

## 2018-04-22 NOTE — ACP (Advance Care Planning) (Signed)
No ACP

## 2018-04-22 NOTE — Patient Instructions (Signed)
Influenza (Flu): Care Instructions  Your Care Instructions    Influenza (flu) is an infection in the lungs and breathing passages. It is caused by the influenza virus. There are different strains, or types, of the flu virus from year to year. Unlike the common cold, the flu comes on suddenly and the symptoms, such as a cough, congestion, fever, chills, fatigue, aches, and pains, are more severe. These symptoms may last up to 10 days. Although the flu can make you feel very sick, it usually doesn't cause serious health problems.  Home treatment is usually all you need for flu symptoms. But your doctor may prescribe antiviral medicine to prevent other health problems, such as pneumonia, from developing. Older people and those who have a long-term health condition, such as lung disease, are most at risk for having pneumonia or other health problems.  Follow-up care is a key part of your treatment and safety. Be sure to make and go to all appointments, and call your doctor if you are having problems. It's also a good idea to know your test results and keep a list of the medicines you take.  How can you care for yourself at home?  ?? Get plenty of rest.  ?? Drink plenty of fluids, enough so that your urine is light yellow or clear like water. If you have kidney, heart, or liver disease and have to limit fluids, talk with your doctor before you increase the amount of fluids you drink.  ?? Take an over-the-counter pain medicine if needed, such as acetaminophen (Tylenol), ibuprofen (Advil, Motrin), or naproxen (Aleve), to relieve fever, headache, and muscle aches. Read and follow all instructions on the label. No one younger than 20 should take aspirin. It has been linked to Reye syndrome, a serious illness.  ?? Do not smoke. Smoking can make the flu worse. If you need help quitting, talk to your doctor about stop-smoking programs and medicines. These can increase your chances of quitting for good.   ?? Breathe moist air from a hot shower or from a sink filled with hot water to help clear a stuffy nose.  ?? Before you use cough and cold medicines, check the label. These medicines may not be safe for young children or for people with certain health problems.  ?? If the skin around your nose and lips becomes sore, put some petroleum jelly on the area.  ?? To ease coughing:  ? Drink fluids to soothe a scratchy throat.  ? Suck on cough drops or plain hard candy.  ? Take an over-the-counter cough medicine that contains dextromethorphan to help you get some sleep. Read and follow all instructions on the label.  ? Raise your head at night with an extra pillow. This may help you rest if coughing keeps you awake.  ?? Take any prescribed medicine exactly as directed. Call your doctor if you think you are having a problem with your medicine.  To avoid spreading the flu  ?? Wash your hands regularly, and keep your hands away from your face.  ?? Stay home from school, work, and other public places until you are feeling better and your fever has been gone for at least 24 hours. The fever needs to have gone away on its own without the help of medicine.  ?? Ask people living with you to talk to their doctors about preventing the flu. They may get antiviral medicine to keep from getting the flu from you.  ?? To prevent the flu   in the future, get a flu vaccine every fall. Encourage people living with you to get the vaccine.  ?? Cover your mouth when you cough or sneeze.  When should you call for help?  Call 911 anytime you think you may need emergency care. For example, call if:  ?? ?? You have severe trouble breathing.   ??Call your doctor now or seek immediate medical care if:  ?? ?? You have new or worse trouble breathing.   ?? ?? You seem to be getting much sicker.   ?? ?? You feel very sleepy or confused.   ?? ?? You have a new or higher fever.   ?? ?? You get a new rash.   ??Watch closely for changes in your health, and be sure to contact your  doctor if:  ?? ?? You begin to get better and then get worse.   ?? ?? You are not getting better after 1 week.   Where can you learn more?  Go to http://www.healthwise.net/[employer]Help[employer]ctions.  Enter L652 in the search box to learn more about "Influenza (Flu): Care Instructions."  Current as of: September 29, 2017  Content Version: 12.2  ?? 2006-2019 [employer]. Care instructions adapted under license by [employer] (which disclaims liability or warranty for this information). If you have questions about a medical condition or this instruction, always ask your healthcare professional. Healthwise, Inc[employer] disclaims any warranty or liability for your use of this information.

## 2018-04-22 NOTE — Progress Notes (Signed)
[facility] [place]  [address]  [place], [place] [address]  Phone: [phone] Fax [phone]  Wyvonne LenzJulie Gotham Raden, MD  04/22/2018         Sally [name] is a 47 y.o. female patient who comes in for sinus and nasal congestion, sore throat, post nasal drip, myalgias, headache, bilateral sinus pain, fever, chills, productive cough and clear nasal discharge .It has been going on for 2 days. It is getting worse. Has tried over the counter medicines without success.  Started running fever to 101 yesterday and has had intense body aches.    Past Medical History:   Diagnosis Date   ??? Abnormal Pap smear    ??? Calculus of kidney    ??? Cough 03/25/2014   ??? Headache    ??? Thyroid disease        Past Surgical History:   Procedure Laterality Date   ??? HX CHOLECYSTECTOMY     ??? HX ORTHOPAEDIC  2011    left plantar fasciitis surgery   ??? HX ORTHOPAEDIC  1999    left 3rd finger cyst removed   ??? HX TONSILLECTOMY         Current Outpatient Medications   Medication Sig Dispense Refill   ??? oseltamivir (TAMIFLU) 75 mg capsule Take 1 Cap by mouth two (2) times a day for 5 days. 10 Cap 0   ??? benzonatate (TESSALON PERLES) 100 mg capsule Take 1 Cap by mouth three (3) times daily as needed for Cough for up to 7 days. 20 Cap 0   ??? promethazine (PHENERGAN) 25 mg tablet Take 1 Tab by mouth every four (4) hours as needed for Nausea. 20 Tab 0   ??? levothyroxine (SYNTHROID) 50 mcg tablet Take 50 mcg by mouth. Alternate with .75 mcg     ??? rizatriptan (MAXALT) 10 mg tablet Take 10 mg by mouth once as needed for Migraine. May repeat in 2 hours if needed     ??? montelukast (SINGULAIR) 10 mg tablet 1 po q hs 30 Tab 11   ??? albuterol (PROVENTIL HFA, VENTOLIN HFA, PROAIR HFA) 90 mcg/actuation inhaler 2 puffs qid prn 1 Inhaler 11   ??? fluticasone (FLONASE) 50 mcg/actuation nasal spray 2 sprays each nostril daily 1 Bottle 6   ??? clindamycin (CLEOCIN T) 1 % external solution Apply  to affected area  two (2) times a day. use thin film on affected area     ??? levonorgestrel (MIRENA) 20 mcg/24 hr (5 years) IUD 1 Each by IntraUTERine route.     ??? levothyroxine (SYNTHROID) 100 mcg tablet Take 1 Tab by mouth Daily (before breakfast). (Patient taking differently: Take 75 mcg by mouth Daily (before breakfast).) 90 Tab 3   ??? cetirizine (ZYRTEC) 10 mg tablet Take  by mouth daily.         Family History   Problem Relation Age of Onset   ??? Diabetes Maternal Grandmother    ??? Cancer Maternal Grandmother [id number]        lung cancer - non smoker   ??? Lung Disease Maternal Grandmother    ??? Diabetes Paternal Grandmother    ??? Diabetes Paternal Grandfather    ??? Cancer Maternal Grandfather         cirrhosis   ??? Hypertension Mother    ??? Thyroid Disease Mother    ??? Elevated Lipids Mother    ??? Gall Bladder Disease Mother    ??? Migraines Mother    ??? Diabetes Father  Social History     Socioeconomic History   ??? Marital status: MARRIED     Spouse name: Not on file   ??? Number of children: Not on file   ??? Years of education: Not on file   ??? Highest education level: Not on file   Occupational History   ??? Not on file   Social Needs   ??? Financial resource strain: Not on file   ??? Food insecurity:     Worry: Not on file     Inability: Not on file   ??? Transportation needs:     Medical: Not on file     Non-medical: Not on file   Tobacco Use   ??? Smoking status: Never Smoker   ??? Smokeless tobacco: Never Used   Substance and Sexual Activity   ??? Alcohol use: No   ??? Drug use: No   ??? Sexual activity: Yes     Partners: Male     Birth control/protection: I.U.D.   Lifestyle   ??? Physical activity:     Days per week: Not on file     Minutes per session: Not on file   ??? Stress: Not on file   Relationships   ??? Social connections:     Talks on phone: Not on file     Gets together: Not on file     Attends religious service: Not on file     Active member of club or organization: Not on file     Attends meetings of clubs or organizations: Not on file      Relationship status: Not on file   ??? Intimate partner violence:     Fear of current or ex partner: Not on file     Emotionally abused: Not on file     Physically abused: Not on file     Forced sexual activity: Not on file   Other Topics Concern   ??? Not on file   Social History Narrative    Lifelong nonsmoker.  Occasional EtOH use.  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  She has lived in [place].  He has indoor dog.       Allergies   Allergen Reactions   ??? Codeine Nausea and Vomiting       Visit Vitals  BP 106/72 (BP 1 Location: Right arm, BP Patient Position: Sitting)   Pulse 83   Temp 98.7 ??F (37.1 ??C)   Resp 16   Ht 5\' 2"  (1.575 m)   Wt 162 lb 9.6 oz (73.8 kg)   SpO2 96%   BMI 29.74 kg/m??     General: Ill-appearing, face flushed, in no distress  HEENT: Normocephalic, atraumatic, pupils equal and reactive to light and accommodation, tympanic membranes intact without erythema or edema, oropharynx clear with no erythema or tonsillar exudate  Neck :supple, no masses, no thyromegaly, no lymphadenopathy  Lungs: clear to auscultation bilaterally  CV: regular rate and rhythm, without murmurs, rubs, or gallops  Abdomen: Soft positive bowel sounds nontender nondistended no masses.  Extremities no lower extremity edema.    Results for orders placed or performed in visit on 04/22/18   AMB POC RAPID INFLUENZA TEST   Result Value Ref Range    VALID INTERNAL CONTROL POC Yes     Influenza A Ag POC Positive Negative Pos/Neg    Influenza B Ag POC Negative Negative Pos/Neg  Diagnoses and all orders for this visit:    1. Asian flu type A  -     oseltamivir (TAMIFLU) 75 mg capsule; Take 1 Cap by mouth two (2) times a day for 5 days.  -     benzonatate (TESSALON PERLES) 100 mg capsule; Take 1 Cap by mouth three (3) times daily as needed for Cough for up to 7 days.   -     promethazine (PHENERGAN) 25 mg tablet; Take 1 Tab by mouth every four (4) hours as needed for Nausea.    2. Fever and chills  -     AMB POC RAPID INFLUENZA TEST        Louie BunJulie M Chatham Howington, MD

## 2018-04-22 NOTE — Progress Notes (Signed)
[facility]  [address]  [place], [place] [address]  Phone: [phone] Fax [phone]  Wyvonne LenzJulie Alias Villagran, MD  04/22/2018         Ms. Laural BenesJohnson is a 47 y.o. female patient who comes in for sinus and nasal congestion, sore throat, post nasal drip, myalgias, headache, bilateral sinus pain, fever, chills, productive cough and clear nasal discharge .It has been going on for 2 days. It is getting worse. Has tried over the counter medicines without success.  Started running fever to 101 yesterday and has had intense body aches.    Past Medical History:   Diagnosis Date   ??? Abnormal Pap smear    ??? Calculus of kidney    ??? Cough 03/25/2014   ??? Headache    ??? Thyroid disease        Past Surgical History:   Procedure Laterality Date   ??? HX CHOLECYSTECTOMY     ??? HX ORTHOPAEDIC  2011    left plantar fasciitis surgery   ??? HX ORTHOPAEDIC  1999    left 3rd finger cyst removed   ??? HX TONSILLECTOMY         Current Outpatient Medications   Medication Sig Dispense Refill   ??? oseltamivir (TAMIFLU) 75 mg capsule Take 1 Cap by mouth two (2) times a day for 5 days. 10 Cap 0   ??? benzonatate (TESSALON PERLES) 100 mg capsule Take 1 Cap by mouth three (3) times daily as needed for Cough for up to 7 days. 20 Cap 0   ??? promethazine (PHENERGAN) 25 mg tablet Take 1 Tab by mouth every four (4) hours as needed for Nausea. 20 Tab 0   ??? levothyroxine (SYNTHROID) 50 mcg tablet Take 50 mcg by mouth. Alternate with .75 mcg     ??? rizatriptan (MAXALT) 10 mg tablet Take 10 mg by mouth once as needed for Migraine. May repeat in 2 hours if needed     ??? montelukast (SINGULAIR) 10 mg tablet 1 po q hs 30 Tab 11   ??? albuterol (PROVENTIL HFA, VENTOLIN HFA, PROAIR HFA) 90 mcg/actuation inhaler 2 puffs qid prn 1 Inhaler 11   ??? fluticasone (FLONASE) 50 mcg/actuation nasal spray 2 sprays each nostril daily 1 Bottle 6   ??? clindamycin (CLEOCIN T) 1 % external solution Apply  to affected area two (2) times a day. use thin film on affected area      ??? levonorgestrel (MIRENA) 20 mcg/24 hr (5 years) IUD 1 Each by IntraUTERine route.     ??? levothyroxine (SYNTHROID) 100 mcg tablet Take 1 Tab by mouth Daily (before breakfast). (Patient taking differently: Take 75 mcg by mouth Daily (before breakfast).) 90 Tab 3   ??? cetirizine (ZYRTEC) 10 mg tablet Take  by mouth daily.         Family History   Problem Relation Age of Onset   ??? Diabetes Maternal Grandmother    ??? Cancer Maternal Grandmother [id number]        lung cancer - non smoker   ??? Lung Disease Maternal Grandmother    ??? Diabetes Paternal Grandmother    ??? Diabetes Paternal Grandfather    ??? Cancer Maternal Grandfather         cirrhosis   ??? Hypertension Mother    ??? Thyroid Disease Mother    ??? Elevated Lipids Mother    ??? Gall Bladder Disease Mother    ??? Migraines Mother    ??? Diabetes Father  Social History     Socioeconomic History   ??? Marital status: MARRIED     Spouse name: Not on file   ??? Number of children: Not on file   ??? Years of education: Not on file   ??? Highest education level: Not on file   Occupational History   ??? Not on file   Social Needs   ??? Financial resource strain: Not on file   ??? Food insecurity:     Worry: Not on file     Inability: Not on file   ??? Transportation needs:     Medical: Not on file     Non-medical: Not on file   Tobacco Use   ??? Smoking status: Never Smoker   ??? Smokeless tobacco: Never Used   Substance and Sexual Activity   ??? Alcohol use: No   ??? Drug use: No   ??? Sexual activity: Yes     Partners: Male     Birth control/protection: I.U.D.   Lifestyle   ??? Physical activity:     Days per week: Not on file     Minutes per session: Not on file   ??? Stress: Not on file   Relationships   ??? Social connections:     Talks on phone: Not on file     Gets together: Not on file     Attends religious service: Not on file     Active member of club or organization: Not on file     Attends meetings of clubs or organizations: Not on file     Relationship status: Not on file   ??? Intimate partner violence:      Fear of current or ex partner: Not on file     Emotionally abused: Not on file     Physically abused: Not on file     Forced sexual activity: Not on file   Other Topics Concern   ??? Not on file   Social History Narrative    Lifelong nonsmoker.  Occasional EtOH use.  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  [place]  He has indoor dog.       Allergies   Allergen Reactions   ??? Codeine Nausea and Vomiting       Visit Vitals  BP 106/72 (BP 1 Location: Right arm, BP Patient Position: Sitting)   Pulse 83   Temp 98.7 ??F (37.1 ??C)   Resp 16   Ht 5\' 2"  (1.575 m)   Wt 162 lb 9.6 oz (73.8 kg)   SpO2 96%   BMI 29.74 kg/m??     General: Ill-appearing, face flushed, in no distress  HEENT: Normocephalic, atraumatic, pupils equal and reactive to light and accommodation, tympanic membranes intact without erythema or edema, oropharynx clear with no erythema or tonsillar exudate  Neck :supple, no masses, no thyromegaly, no lymphadenopathy  Lungs: clear to auscultation bilaterally  CV: regular rate and rhythm, without murmurs, rubs, or gallops  Abdomen: Soft positive bowel sounds nontender nondistended no masses.  Extremities no lower extremity edema.    Results for orders placed or performed in visit on 04/22/18   AMB POC RAPID INFLUENZA TEST   Result Value Ref Range    VALID INTERNAL CONTROL POC Yes     Influenza A Ag POC Positive Negative Pos/Neg    Influenza B Ag POC Negative Negative Pos/Neg  Diagnoses and all orders for this visit:    1. Asian flu type A  -     oseltamivir (TAMIFLU) 75 mg capsule; Take 1 Cap by mouth two (2) times a day for 5 days.  -     benzonatate (TESSALON PERLES) 100 mg capsule; Take 1 Cap by mouth three (3) times daily as needed for Cough for up to 7 days.  -     promethazine (PHENERGAN) 25 mg tablet; Take 1 Tab by mouth every four (4) hours as needed for Nausea.    2. Fever and  chills  -     AMB POC RAPID INFLUENZA TEST        Louie Bun, MD

## 2018-05-16 NOTE — Telephone Encounter (Signed)
TRIAGE CALL       Complaint: Dry cough  Cough: yes  Productive:  no  Bloody Sputum:  no  Increased SOB/Wheezing:  yes  Duration: 3 weeks  Fever/Chills: no  OTC Meds tried: none

## 2018-05-16 NOTE — Telephone Encounter (Signed)
Given dx of flu, needs to monitor closely for worsening of symptoms.    For now, would use albuterol inhaler 2 puffs 4 times daily over the weekend, and if better on Monday, can decrease to as needed.    OTC delsym per package directions for cough suppression.    She should seek urgent care over the weekend if she has significant worsening in cough, develops wheezing, shortness of breath, purulent sputum or recurrent fever.    Review of travel screening flow sheet shows no international travel or definite sick  Contacts.

## 2018-05-16 NOTE — Telephone Encounter (Signed)
Last seen: 11/01/17  Hx: small airways disease, allergic rhinitis    Chart indicates dx & tx for type A flu 12/31; notes that she just has had this nagging cough that is worse in the evenings since having the flu; voice is also raspy; has used rescue inhaler a few times, feels like this has helped some w/ cough control but not complete; chest has a heaviness; no edema, no fever since the flu; no other inhaled medications; takes zyrtec & singulair daily; advised likely will get answer back on Monday am.  Please advise if something can be added to regimen or if appt needed.

## 2018-05-21 NOTE — Telephone Encounter (Signed)
Pt states she has a dry nagging cough. States it started about 2 weeks after she had the flu. Reviewed plan from last phone call. States no improvement. appt scheduled on 1/30 at 9 am. Instructed pt to arrive at 8:40. Verb understanding.

## 2018-05-21 NOTE — Telephone Encounter (Signed)
From: Sally [name]  To: Eliseo Gum, NP  Sent: 05/19/2018 6:41 PM EST  Subject: Appointment Request    Appointment Request From: Sally [name]    With Provider: Eliseo Gum, NP [[facility]]    Preferred Date Range: Any    Preferred Times: Any time    Reason for visit: Request an Appointment    Comments:  Lingering cough; chest tight/heavy; worse in the evenings.

## 2018-05-22 ENCOUNTER — Ambulatory Visit: Attending: Acute Care | Primary: Family Medicine

## 2018-05-22 ENCOUNTER — Ambulatory Visit
Admit: 2018-05-22 | Discharge: 2018-05-22 | Payer: PRIVATE HEALTH INSURANCE | Attending: Acute Care | Primary: Family Medicine

## 2018-05-22 DIAGNOSIS — J984 Other disorders of lung: Secondary | ICD-10-CM

## 2018-05-22 NOTE — Patient Instructions (Signed)
Warm salt water gargles, throat lozenges as needed.  Continue Singulair, Zyrtec and Flonase.  Albuterol 2 puffs 4 times daily if needed for shortness of breath, wheezing, persistent cough.  I have provided 2 weeks sample of Breo 200/25 for her to use once daily if she has persistent symptoms.  Follow-up as needed.  I discussed symptoms for which she should contact us to be seen.

## 2018-05-22 NOTE — Progress Notes (Deleted)
She is a 48 year old female with history of small airways disease, allergic rhinitis, chronic cough, seen today as a work in visit secondary to increased cough after having flu.  Tested + influenza A 04/22/2018, treated with Tamiflu and Tessalon Perles.    Historically, cough had improved with management of allergy symptoms which were controlled with Singulair, antihistamine and nasal spray.    Spirometry 03/2014 - Normal spirometry.?? Prior PFTs were suggestive of obstruction in the past.  CXR 03/2014 - no acute process.  Methacholine challenge 03/2014 -?? consistent with essentially a negative study, but smaller airways noted marked reversibility post bronchodilators.?? Please note FEF 25-75 improved by 720 mL from its lowest value during level 4, post bronchodilators  Spirometry 05/05/2015???normal, significant interval improvement in FVC and FEV1.  Spirometry 11/02/2015 - normal, interval decline.  Spirometry 11/01/2016???normal. ??Slight but statistically insignificant improvement in FVC. ??No significant change in FEV1.  Spirometry 11/01/2017???normal.

## 2018-05-22 NOTE — Progress Notes (Signed)
[facility]  [name]  [place], [place] [address]  [phone]    Patient Name:  Sally [name]    Date of Birth:  1970/11/04    Office Visit 05/22/2018      CHIEF COMPLAINT:      Chief Complaint   Patient presents with   ??? Follow-up   ??? Cough       HISTORY OF PRESENT ILLNESS:      She is a 48 year old female with history of small airways disease, allergic rhinitis, chronic cough, seen today as a work in visit secondary to increased cough after having flu.  Tested + influenza A 04/22/2018, treated with Tamiflu and Tessalon Perles.    Historically, cough had improved with management of allergy symptoms which were controlled with Singulair, antihistamine and nasal spray.    Initial symptoms approximately 4 weeks ago included sinus/nasal congestion, sore throat, postnasal drip, myalgias, headache, fever, chills productive cough.  Overall, she has improved but has lingering nonproductive cough.  She is clearing her throat frequently.  Cough seems to have increased although other symptoms have improved.  She reports compliance with Singulair, Flonase and Zyrtec.  She does not use saline rinses.    She denies shortness of breath.  She had one episode of wheezing last week which responded well to albuterol inhaler.  She has been off of inhaled steroid for at least a year.  States that she felt that she should be checked by us given episode of wheezing last week.    Past Medical History:   Diagnosis Date   ??? Abnormal Pap smear    ??? Calculus of kidney    ??? Cough 03/25/2014   ??? Headache    ??? Thyroid disease        Problem List  Date Reviewed: 11/01/2017          Codes Class Noted    Elevated LFTs ICD-10-CM: R94.5  ICD-9-CM: 790.6  01/02/2017    Overview Signed 01/02/2017  4:38 PM by Louie Bunangler, Julie M, MD     Liver biopsy normal; idiopathic; follow with GI regularly.             Small airways disease (Chronic) ICD-10-CM: J98.4  ICD-9-CM: 518.89  05/05/2014        Cough ICD-10-CM: R05   ICD-9-CM: 786.2  03/25/2014        Hx of seasonal allergies (Chronic) ICD-10-CM: Z88.9  ICD-9-CM: V15.09  03/25/2014        Irregular menses ICD-10-CM: N92.6  ICD-9-CM: 626.4  04/24/2013        Unspecified hypothyroidism ICD-10-CM: E03.9  ICD-9-CM: 244.9  02/05/2013        Migraine ICD-10-CM: G43.909  ICD-9-CM: 346.90  02/05/2013    Overview Addendum 02/05/2013 11:13 AM by Louie Bunangler, Julie M, MD     DId try Topamax - got kidney stone             Allergic rhinitis due to pollen ICD-10-CM: J30.1  ICD-9-CM: 477.0  02/05/2013              Past Surgical History:   Procedure Laterality Date   ??? HX CHOLECYSTECTOMY     ??? HX ORTHOPAEDIC  2011    left plantar fasciitis surgery   ??? HX ORTHOPAEDIC  1999    left 3rd finger cyst removed   ??? HX TONSILLECTOMY         DIAGNOSTICS:      Spirometry 03/2014 - Normal  spirometry.?? Prior PFTs were suggestive of obstruction in the past.  CXR 03/2014 - no acute process.  Methacholine challenge 03/2014 -?? consistent with essentially a negative study, but smaller airways noted marked reversibility post bronchodilators.?? Please note FEF 25-75 improved by 720 mL from its lowest value during level 4, post bronchodilators  Spirometry 05/05/2015???normal, significant interval improvement in FVC and FEV1.  Spirometry 11/02/2015 - normal, interval decline.  Spirometry 11/01/2016???normal. ??Slight but statistically insignificant improvement in FVC. ??No significant change in FEV1.  Spirometry 11/01/2017???normal.    Social History     Socioeconomic History   ??? Marital status: MARRIED     Spouse name: Not on file   ??? Number of children: Not on file   ??? Years of education: Not on file   ??? Highest education level: Not on file   Tobacco Use   ??? Smoking status: Never Smoker   ??? Smokeless tobacco: Never Used   Substance and Sexual Activity   ??? Alcohol use: No   ??? Drug use: No   ??? Sexual activity: Yes     Partners: Male     Birth control/protection: I.U.D.   Social History Narrative     Lifelong nonsmoker.  Occasional EtOH use.  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  [place] has [place] in [place]  He has indoor dog.       Family History   Problem Relation Age of Onset   ??? Diabetes Maternal Grandmother    ??? Cancer Maternal Grandmother [id number]        lung cancer - non smoker   ??? Lung Disease Maternal Grandmother    ??? Diabetes Paternal Grandmother    ??? Diabetes Paternal Grandfather    ??? Cancer Maternal Grandfather         cirrhosis   ??? Hypertension Mother    ??? Thyroid Disease Mother    ??? Elevated Lipids Mother    ??? Gall Bladder Disease Mother    ??? Migraines Mother    ??? Diabetes Father        Allergies   Allergen Reactions   ??? Codeine Nausea and Vomiting       Current Outpatient Medications   Medication Sig   ??? levothyroxine (SYNTHROID) 50 mcg tablet Take 50 mcg by mouth. Alternate with .75 mcg   ??? rizatriptan (MAXALT) 10 mg tablet Take 10 mg by mouth once as needed for Migraine. May repeat in 2 hours if needed   ??? montelukast (SINGULAIR) 10 mg tablet 1 po q hs   ??? albuterol (PROVENTIL HFA, VENTOLIN HFA, PROAIR HFA) 90 mcg/actuation inhaler 2 puffs qid prn   ??? fluticasone (FLONASE) 50 mcg/actuation nasal spray 2 sprays each nostril daily   ??? clindamycin (CLEOCIN T) 1 % external solution Apply  to affected area two (2) times a day. use thin film on affected area   ??? levonorgestrel (MIRENA) 20 mcg/24 hr (5 years) IUD 1 Each by IntraUTERine route.   ??? levothyroxine (SYNTHROID) 100 mcg tablet Take 1 Tab by mouth Daily (before breakfast). (Patient taking differently: Take 75 mcg by mouth Daily (before breakfast).)   ??? cetirizine (ZYRTEC) 10 mg tablet Take  by mouth daily.   ??? promethazine (PHENERGAN) 25 mg tablet Take 1 Tab by mouth every four (4) hours as needed for Nausea.     No current facility-administered medications for this visit.  REVIEW OF SYSTEMS:    Review of Systems    Constitutional: Negative for chills, fever, malaise/fatigue and weight loss.   Respiratory: Positive for cough.    Cardiovascular: Negative for chest pain, palpitations and leg swelling.   Gastrointestinal: Negative for abdominal pain, constipation, diarrhea, heartburn, nausea and vomiting.   Neurological: Negative for dizziness, tremors, seizures, weakness and headaches.         PHYSICAL EXAM:    Vitals:    05/22/18 0853   BP: 108/78   Pulse: 89   Resp: 18   Temp: 97.7 ??F (36.5 ??C)   TempSrc: Temporal   SpO2: 98%  Comment: on RA   Weight: 161 lb (73 kg)   Height: 5\' 2"  (1.575 m)     Body mass index is 29.45 kg/m??.      GENERAL APPEARANCE:  The patient is normal weight and in no respiratory distress.    HEENT:  PERRL.  Conjunctivae unremarkable.  Nasal mucosa is red without epistaxis, exudate, or polyps.   Posterior oropharynx is moist, pink, no exudate or lesions.  No significant postnasal drainage evident at this time    NECK/LYMPHATIC:   Symmetrical with no elevation of jugular venous pulsation.  Trachea midline. No thyroid enlargement.  No cervical adenopathy.    LUNGS:   Normal respiratory effort with symmetrical lung expansion.   Breath sounds???lungs are completely clear.Marland Kitchen    HEART:   There is a regular rate and rhythm.  No murmur, rub, or gallop.  There is no edema in the lower extremities.    ABDOMEN:  Soft and non-tender.  No hepatosplenomegaly.  Bowel sounds are normal.      NEURO:  The patient is alert and oriented to person, place, and time.  Memory appears intact and mood is normal.  No gross sensorimotor deficits are present.    DIAGNOSTIC TESTS: Studies were personally reviewed by me and discussed with the patient.      CXR PA and lateral:    Results for orders placed during the hospital encounter of 03/25/14   XR CHEST PA LAT    Narrative Chest PA and lateral views    Indication: [facility] patient history of asthma unspecified asthma severity  uncomplicated for 10 years. Chronic cough for 2 months.     Findings: No prior studies for comparison. Lungs are clear and normally expanded  with normal cardiomediastinal silhouette. No pleural or acute gross chest wall  abnormalities. The gallbladder is surgically absent.      Impression Impression: No acute abnormality.             ASSESSMENT:   (Medical Decision Making)                                                                                                                                          Encounter Diagnoses   Name Primary?   ??? Small  airways disease Yes   ??? Cough    ??? Allergic rhinitis due to pollen, unspecified seasonality      Recent influenza type A, treated with Tamiflu.  Currently afebrile, denies chills, shortness of breath.  She had one episode of wheezing last week for which she used albuterol inhaler and had good response.  She reports issues with postnasal drainage and clearing her throat frequently, which is likely source of her cough.    Lungs are clear.    Cough is likely secondary to post viral syndrome/postnasal drip.                        PLAN:     Warm salt water gargles, throat lozenges as needed.  Continue Singulair, Zyrtec and Flonase.  Albuterol 2 puffs 4 times daily if needed for shortness of breath, wheezing, persistent cough.  I have provided 2 weeks sample of Breo 200/25 for her to use once daily if she has persistent symptoms.  Follow-up as needed.  I discussed symptoms for which she should contact us to be seen.      Eliseo Gum, NP    Total  time spent with patient -20  min.   Over 50% of today's office visit was spent in face to face time reviewing test results, prognosis, importance of compliance, education about disease process, benefits of medications, instructions for management of acute symptoms, and follow up plans.     Collaborating MD: Dr. Waynette Buttery    Electronically signed. Dictated using voice recognition software.  Proof read but unrecognized errors may exist.

## 2018-05-22 NOTE — Progress Notes (Addendum)
Progress Notes by Eliseo Gum, NP at 05/22/18 0900                Author: Eliseo Gum, NP  Service: --  Author Type: Nurse Practitioner       Filed: 05/22/18 0944  Encounter Date: 05/22/2018  Status: Signed          Editor: Eliseo Gum, NP (Nurse Practitioner)               [facility]   [address]   [place], [place] [address]   [phone]      Patient Name:  Sally [name]      Date of Birth:  12-29-1970      Office Visit 05/22/2018        CHIEF COMPLAINT:          Chief Complaint       Patient presents with        ?  Follow-up        ?  Cough           HISTORY OF PRESENT ILLNESS:        She is a 48 year old female with history of small airways disease, allergic rhinitis, chronic cough, seen today as a work in visit secondary to increased cough after having flu.  Tested  + influenza A 04/22/2018, treated with Tamiflu and Tessalon Perles.      Historically, cough had improved with management of allergy symptoms which were controlled with Singulair, antihistamine and nasal spray.      Initial symptoms approximately 4 weeks ago included sinus/nasal congestion, sore throat, postnasal drip, myalgias, headache, fever, chills productive cough.  Overall, she has improved  but has lingering nonproductive cough.  She is clearing her throat frequently.  Cough seems to have increased although other symptoms have improved.  She reports compliance with Singulair, Flonase and Zyrtec.  She does not use saline rinses.      She denies shortness of breath.  She had one episode of wheezing last week which responded well to albuterol inhaler.  She has been off of inhaled steroid for at least a year.  States that she felt that she should be checked by [place] given episode of wheezing  last week.        Past Medical History:        Diagnosis  Date         ?  Abnormal Pap smear       ?  Calculus of kidney       ?  Cough  03/25/2014     ?  Headache           ?  Thyroid disease                 Problem List   Date Reviewed:  09-Nov-2017                        Codes  Class  Noted             Elevated LFTs  ICD-10-CM: R94.5   ICD-9-CM: 790.6    01/02/2017          Overview Signed 01/02/2017  4:38 PM by Louie Bun, MD            Liver biopsy normal; idiopathic; follow with GI regularly.  Small airways disease (Chronic)  ICD-10-CM: J98.4   ICD-9-CM: 518.89    05/05/2014                       Cough  ICD-10-CM: R05   ICD-9-CM: 786.2    03/25/2014                       Hx of seasonal allergies (Chronic)  ICD-10-CM: Z88.9   ICD-9-CM: V15.09    03/25/2014                       Irregular menses  ICD-10-CM: N92.6   ICD-9-CM: 626.4    04/24/2013                       Unspecified hypothyroidism  ICD-10-CM: E03.9   ICD-9-CM: 244.9    02/05/2013                       Migraine  ICD-10-CM: G43.909   ICD-9-CM: 346.90    02/05/2013          Overview Addendum 02/05/2013 11:13 AM by Louie Bun, MD            DId try Topamax - got kidney stone                                   Allergic rhinitis due to pollen  ICD-10-CM: J30.1   ICD-9-CM: 477.0    02/05/2013                            Past Surgical History:         Procedure  Laterality  Date          ?  HX CHOLECYSTECTOMY         ?  HX ORTHOPAEDIC    2011          left plantar fasciitis surgery          ?  HX ORTHOPAEDIC    1999          left 3rd finger cyst removed          ?  HX TONSILLECTOMY               DIAGNOSTICS:        Spirometry 03/2014 - Normal spirometry.?? Prior PFTs were suggestive of obstruction in the past.   CXR 03/2014 - no acute process.   Methacholine challenge 03/2014 -?? consistent with essentially a negative study, but smaller airways noted marked reversibility post bronchodilators.?? Please note FEF 25-75 improved by 720 mL from its lowest value during level 4, post bronchodilators   Spirometry 05/05/2015-normal, significant interval improvement in FVC and FEV1.   Spirometry 11/02/2015 - normal, interval  decline.   Spirometry 11/01/2016-normal. ??Slight but statistically insignificant improvement in FVC. ??No significant change in FEV1.   Spirometry 11/01/2017-normal.        Social History          Socioeconomic History         ?  Marital status:  MARRIED              Spouse name:  Not on file         ?  Number of children:  Not on file     ?  Years of education:  Not on file     ?  Highest education level:  Not on file       Tobacco Use         ?  Smoking status:  Never Smoker     ?  Smokeless tobacco:  Never Used       Substance and Sexual Activity         ?  Alcohol use:  No     ?  Drug use:  No     ?  Sexual activity:  Yes              Partners:  Male         Birth control/protection:  I.U.D.       Social History Narrative          Lifelong nonsmoker.  Occasional EtOH use.  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  [place]  He has indoor dog.             Family History         Problem  Relation  Age of Onset          ?  Diabetes  Maternal Grandmother       ?  Cancer  Maternal Grandmother  387              lung cancer - non smoker          ?  Lung Disease  Maternal Grandmother       ?  Diabetes  Paternal Grandmother       ?  Diabetes  Paternal Grandfather       ?  Cancer  Maternal Grandfather                cirrhosis          ?  Hypertension  Mother       ?  Thyroid Disease  Mother       ?  Elevated Lipids  Mother       ?  Gall Bladder Disease  Mother       ?  Migraines  Mother            ?  Diabetes  Father               Allergies        Allergen  Reactions         ?  Codeine  Nausea and Vomiting             Current Outpatient Medications        Medication  Sig         ?  levothyroxine (SYNTHROID) 50 mcg tablet  Take 50 mcg by mouth. Alternate with .75 mcg     ?  rizatriptan (MAXALT) 10 mg tablet  Take 10 mg by mouth once as needed for Migraine. May repeat in 2 hours if needed     ?  montelukast  (SINGULAIR) 10 mg tablet  1 po q hs     ?  albuterol (PROVENTIL HFA, VENTOLIN HFA, PROAIR HFA) 90 mcg/actuation inhaler  2 puffs qid prn     ?  fluticasone (FLONASE) 50 mcg/actuation nasal spray  2 sprays each nostril daily     ?  clindamycin (CLEOCIN T) 1 % external solution  Apply  to  affected area two (2) times a day. use thin film on affected area     ?  levonorgestrel (MIRENA) 20 mcg/24 hr (5 years) IUD  1 Each by IntraUTERine route.         ?  levothyroxine (SYNTHROID) 100 mcg tablet  Take 1 Tab by mouth Daily (before breakfast). (Patient taking differently: Take 75 mcg by mouth Daily (before breakfast).)         ?  cetirizine (ZYRTEC) 10 mg tablet  Take  by mouth daily.         ?  promethazine (PHENERGAN) 25 mg tablet  Take 1 Tab by mouth every four (4) hours as needed for Nausea.          No current facility-administered medications for this visit.               REVIEW OF SYSTEMS:      Review of Systems    Constitutional: Negative for chills, fever, malaise/fatigue and weight loss.    Respiratory: Positive for cough.     Cardiovascular: Negative for chest pain, palpitations and leg swelling.    Gastrointestinal: Negative for abdominal pain, constipation, diarrhea, heartburn, nausea and vomiting.    Neurological: Negative for dizziness, tremors, seizures, weakness and headaches.             PHYSICAL EXAM:        Vitals:          05/22/18 0853        BP:  108/78     Pulse:  89     Resp:  18     Temp:  97.7 ??F (36.5 ??C)     TempSrc:  Temporal     SpO2:  98%   Comment: on RA     Weight:  161 lb (73 kg)        Height:  5\' 2"  (1.575 m)        Body mass index is 29.45 kg/m??.         GENERAL APPEARANCE:   The patient is normal weight and in no respiratory distress.      HEENT:   PERRL.  Conjunctivae unremarkable.   Nasal mucosa is red without epistaxis, exudate, or polyps.   Posterior oropharynx is moist, pink, no exudate or lesions.  No significant postnasal drainage evident at this time      NECK/LYMPHATIC:     Symmetrical with no elevation of jugular venous pulsation.  Trachea midline. No thyroid enlargement.  No cervical adenopathy.      LUNGS:    Normal respiratory effort with symmetrical lung expansion.   Breath sounds-lungs are completely clear.Marland Kitchen      HEART:    There is a regular rate and rhythm.  No murmur, rub, or gallop.  There is no edema in the lower extremities.      ABDOMEN:   Soft and non-tender.  No hepatosplenomegaly.  Bowel sounds are normal.        NEURO:   The patient is alert and oriented to person, place, and time.  Memory appears intact and mood is normal.  No gross sensorimotor deficits are present.      DIAGNOSTIC TESTS: Studies were personally reviewed by me and discussed with the patient.         CXR PA and lateral:       Results for orders placed during the hospital encounter of 03/25/14     XR CHEST PA LAT  Narrative  Chest PA and lateral views      Indication: [place] patient history of asthma unspecified asthma severity   uncomplicated for 10 years. Chronic cough for 2 months.      Findings: No prior studies for comparison. Lungs are clear and normally expanded   with normal cardiomediastinal silhouette. No pleural or acute gross chest wall   abnormalities. The gallbladder is surgically absent.              Impression  Impression: No acute abnormality.                      ASSESSMENT:   (Medical Decision Making)                                                                                                                                              Encounter Diagnoses        Name  Primary?         ?  Small airways disease  Yes     ?  Cough           ?  Allergic rhinitis due to pollen, unspecified seasonality          Recent influenza type A, treated with Tamiflu.  Currently afebrile, denies chills, shortness of breath.  She had one episode of wheezing last week for which she used albuterol inhaler and had good response.  She reports issues with postnasal drainage  and clearing her  throat frequently, which is likely source of her cough.      Lungs are clear.      Cough is likely secondary to post viral syndrome/postnasal drip.                           PLAN:       Warm salt water gargles, throat lozenges as needed.   Continue Singulair, Zyrtec and Flonase.   Albuterol 2 puffs 4 times daily if needed for shortness of breath, wheezing, persistent cough.   I have provided 2 weeks sample of Breo 200/25 for her to use once daily if she has persistent symptoms.   Follow-up as needed.   I discussed symptoms for which she should contact us to be seen.         Eliseo Gum, NP      Total  time spent with patient -20  min.    Over 50% of today's office visit was spent in face to face time reviewing test results, prognosis, importance of compliance, education about disease process, benefits of medications, instructions for management of acute symptoms, and follow up plans.       Collaborating MD: Dr. Waynette Buttery      Electronically signed. Dictated using voice recognition software.  Proof read but unrecognized errors may exist.

## 2018-08-08 ENCOUNTER — Encounter

## 2018-11-12 ENCOUNTER — Encounter

## 2018-11-12 NOTE — Telephone Encounter (Signed)
Patient needs to call the office for refills.

## 2018-11-13 ENCOUNTER — Encounter

## 2018-11-13 NOTE — Telephone Encounter (Signed)
From: [name]  To: Louie Bun, MD  Sent: 11/12/2018 8:07 PM EDT  Subject: Prescription Question    I have been taking Singulair prescribed by Almon Hercules. However, she has dismissed seeing me due to no longer needing services. But my prescription is needing refilled. Is this something you can refill for me?    Thanks,  Sally [name]

## 2018-11-13 NOTE — Telephone Encounter (Signed)
-----   Message from Constance Holster sent at 11/12/2018  8:07 PM EDT -----  Regarding: Prescription Question  Contact: [phone]  I have been taking Singulair prescribed by Almon Hercules.  However, she has dismissed seeing me due to no longer needing services.  But my prescription is needing refilled.  Is this something you can refill for me?    Thanks,  Sally [name]

## 2018-11-14 MED ORDER — MONTELUKAST 10 MG TAB
10 mg | ORAL_TABLET | ORAL | 11 refills | Status: DC
Start: 2018-11-14 — End: 2019-11-06

## 2018-11-14 NOTE — Telephone Encounter (Signed)
Let patient know med is escribed to their pharmacy.

## 2018-12-19 ENCOUNTER — Ambulatory Visit: Payer: PRIVATE HEALTH INSURANCE | Primary: Family Medicine

## 2019-01-10 ENCOUNTER — Ambulatory Visit: Payer: PRIVATE HEALTH INSURANCE | Primary: Family Medicine

## 2019-03-01 ENCOUNTER — Encounter

## 2019-03-02 MED ORDER — RIZATRIPTAN 10 MG TAB, RAPID DISSOLVE
10 mg | ORAL_TABLET | ORAL | 11 refills | Status: AC
Start: 2019-03-02 — End: ?

## 2019-03-13 ENCOUNTER — Inpatient Hospital Stay: Admit: 2019-03-13 | Payer: PRIVATE HEALTH INSURANCE | Attending: Family Medicine | Primary: Family Medicine

## 2019-03-13 DIAGNOSIS — Z1231 Encounter for screening mammogram for malignant neoplasm of breast: Secondary | ICD-10-CM

## 2019-10-02 ENCOUNTER — Ambulatory Visit: Attending: Medical | Primary: Family Medicine

## 2019-10-02 ENCOUNTER — Ambulatory Visit
Admit: 2019-10-02 | Discharge: 2019-10-02 | Payer: BLUE CROSS/BLUE SHIELD | Attending: Medical | Primary: Family Medicine

## 2019-10-02 DIAGNOSIS — R21 Rash and other nonspecific skin eruption: Secondary | ICD-10-CM

## 2019-10-02 MED ORDER — MOMETASONE 0.1 % TOPICAL CREAM
0.1 % | Freq: Every day | CUTANEOUS | 0 refills | Status: AC
Start: 2019-10-02 — End: ?

## 2019-10-02 NOTE — Progress Notes (Signed)
Progress Notes by Noreene Filbert, PA-C at 10/02/19 1330                Author: Noreene Filbert, PA-C  Service: --  Author Type: Physician Assistant       Filed: 10/02/19 1714  Encounter Date: 10/02/2019  Status: Signed          Editor: Reham Slabaugh, Orinda Kenner (Physician Assistant)               [facility]   [address]   [place], [place] [address]   Phone [phone]         Patient: Sally [name]   Date of Birth: December 14, 1970   Age 49 y.o.   Sex female   MEDICAL RECORD NUMBER [id number]   Visit Date: 10/02/19   Author:  Noreene Filbert, PA-C      Family Practice Clinic Note        Chief Complaint       Patient presents with        ?  Insect Bite             Few spider bites to be looked at, arm and stomach           History of Present Illness   This is a 49 year old female who presents today for evaluation of 2 spots on her skin, one on the lower abdomen and one noted on the right forearm.  The one on the lower abdomen appeared approximately  6 days ago.  She sought after she had been working in the yard.  The one on the right forearm showed up 2 days ago.  She denies any visualized insect stings/bites but is concerned that these may represent spider bites.  The one on the abdomen is irritated  feeling in the 1 on the right forearm is pruritic.  There is erythema surrounding what appears to be a bite mark on both.  She denies any streaking erythema.  She has not had fever or chills.  There does not appear to be any erosion of the skin.  No numbness  or tingling noted in the surrounding area.  Denies any shortness of breath or wheeze.  She has not had lip swelling, tongue swelling or difficulty swallowing.      Past History:      Past Medical history      Past Medical History:        Diagnosis  Date         ?  Abnormal Pap smear       ?  Calculus of kidney       ?  Cough  03/25/2014     ?  Headache           ?  Thyroid disease             Current Problem List:      Patient Active Problem List         Diagnosis  Code         ?  Unspecified hypothyroidism  E03.9     ?  Migraine  G43.909     ?  Allergic rhinitis due to pollen  J30.1     ?  Irregular menses  N92.6     ?  Cough  R05     ?  Hx of seasonal allergies  Z88.9     ?  Small airways disease  J98.4         ?  Elevated LFTs  R79.89           Current Medications:      Current Outpatient Medications         Medication  Sig  Dispense          ?  rizatriptan (MAXALT-MLT) 10 mg disintegrating tablet  DISSOLVE ONE TABLET BY MOUTH ONCE AS NEEDED FOR MIGRAINE FOR UP TO 1 DOSE  12 Tab     ?  montelukast (SINGULAIR) 10 mg tablet  1 po q hs  30 Tab     ?  levothyroxine (SYNTHROID) 50 mcg tablet  Take 50 mcg by mouth. Alternate with .75 mcg       ?  rizatriptan (MAXALT) 10 mg tablet  Take 10 mg by mouth once as needed for Migraine. May repeat in 2 hours if needed       ?  fluticasone (FLONASE) 50 mcg/actuation nasal spray  2 sprays each nostril daily  1 Bottle     ?  clindamycin (CLEOCIN T) 1 % external solution  Apply  to affected area two (2) times a day. use thin film on affected area       ?  levonorgestrel (MIRENA) 20 mcg/24 hr (5 years) IUD  1 Each by IntraUTERine route.       ?  levothyroxine (SYNTHROID) 100 mcg tablet  Take 1 Tab by mouth Daily (before breakfast). (Patient taking differently: Take 75 mcg by mouth Daily (before breakfast).)  90 Tab          ?  cetirizine (ZYRTEC) 10 mg tablet  Take  by mouth daily.            No current facility-administered medications for this visit.           Allergies:     Allergies        Allergen  Reactions         ?  Codeine  Nausea and Vomiting           Surgical History:     Past Surgical History:         Procedure  Laterality  Date          ?  HX CHOLECYSTECTOMY         ?  HX ORTHOPAEDIC    2011          left plantar fasciitis surgery          ?  HX ORTHOPAEDIC    1999          left 3rd finger cyst removed          ?  HX TONSILLECTOMY               Family History:     Family History         Problem  Relation  Age of  Onset          ?  Diabetes  Maternal Grandmother       ?  Cancer  Maternal Grandmother  46              lung cancer - non smoker          ?  Lung Disease  Maternal Grandmother       ?  Diabetes  Paternal Grandmother       ?  Diabetes  Paternal Grandfather       ?  Cancer  Maternal Grandfather  cirrhosis          ?  Hypertension  Mother       ?  Thyroid Disease  Mother       ?  Elevated Lipids  Mother       ?  Gall Bladder Disease  Mother       ?  Migraines  Mother            ?  Diabetes  Father             Social History:      Social History          Social History Narrative          Lifelong nonsmoker.  Occasional EtOH use.  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  [place] has lived in [place].  He has indoor dog.           Social History          Socioeconomic History         ?  Marital status:  MARRIED              Spouse name:  Not on file         ?  Number of children:  Not on file     ?  Years of education:  Not on file     ?  Highest education level:  Not on file       Occupational History        ?  Not on file       Tobacco Use         ?  Smoking status:  Never Smoker     ?  Smokeless tobacco:  Never Used       Substance and Sexual Activity         ?  Alcohol use:  No     ?  Drug use:  No     ?  Sexual activity:  Yes              Partners:  Male         Birth control/protection:  I.U.D.        Other Topics  Concern        ?  Not on file       Social History Narrative          Lifelong nonsmoker.  Occasional EtOH use.  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  [place] has lived in [place].  He has indoor dog.          Social Determinants of Health          Financial Resource Strain:         ?  Difficulty of Paying Living Expenses:        Food Insecurity:         ?  Worried About Brewing technologistunning Out of  Food in the Last Year:      ?  Baristaan Out of Food in the Last Year:        Transportation Needs:         ?  Freight forwarderLack of Transportation (Medical):      ?  Lack of Transportation (Non-Medical):        Physical Activity:         ?  Days of Exercise per Week:      ?  Minutes of Exercise per Session:        Stress:         ?  Feeling of Stress :        Social Connections:         ?  Frequency of Communication with Friends and Family:      ?  Frequency of Social Gatherings with Friends and Family:      ?  Attends Religious Services:      ?  Active Member of Clubs or Organizations:      ?  Attends Archivist Meetings:      ?  Marital Status:        Intimate Partner Violence:         ?  Fear of Current or Ex-Partner:      ?  Emotionally Abused:      ?  Physically Abused:         ?  Sexually Abused:               ROS   Review of Systems    Constitutional: Negative for chills and fever.    HENT: Negative for trouble swallowing.     Respiratory: Negative for shortness of breath and wheezing.     Cardiovascular: Negative for chest pain and palpitations.    Skin: Positive for rash and wound .             Visit Vitals      BP  137/86 (BP 1 Location: Right arm, BP Patient Position: Sitting, BP Cuff Size: Adult long)     Pulse  83     Temp  98.1 ??F (36.7 ??C) (Temporal)     Resp  18     Ht  5\' 2"  (1.575 m)     Wt  182 lb 12.8 oz (82.9 kg)     SpO2  98%        BMI  33.43 kg/m??        Body mass index is 33.43 kg/m??.       Physical Exam      Physical Exam   Vitals and nursing note reviewed.   Constitutional:        Appearance: Normal appearance. She is not ill-appearing.   HENT :       Head: Normocephalic.      Right Ear: External ear normal.      Left Ear: External ear normal.      Nose: Nose normal.      Mouth/Throat:      Pharynx: Oropharynx is clear.   Eyes:       Conjunctiva/sclera: Conjunctivae normal.   Cardiovascular:       Rate and Rhythm: Normal rate and regular rhythm.      Heart sounds: Normal heart sounds.    Pulmonary:        Effort: Pulmonary effort is normal.      Breath sounds: Normal breath sounds.   Musculoskeletal :      Cervical back: Neck supple.     Lymphadenopathy:       Cervical: No cervical adenopathy.   Skin :      Comments: Discrete erythematous macular patch on the abdomen with central area that may be puncture wound/insect bite.  Similar appearing area on the right forearm with the same central abnormality.   See pictures below    Neurological:  Mental Status: She is alert.   Psychiatric :         Mood and Affect: Mood normal.         Behavior: Behavior normal.                          ASSESSMENT & PLAN     Encounter Diagnoses              ICD-10-CM  ICD-9-CM          1.  Rash/skin eruption   R21  782.1           1. Rash/skin eruption   *Uncertain cause.  May represent insect bite/sting.  There does not seem to be any progressive rash or worsening symptomatology.  Trial of Elocon cream to be applied daily as needed for itch.   *Return for any worsening symptoms or persistent rash.  We could consider referral to dermatology or biopsy if warranted.   - mometasone (ELOCON) 0.1 % topical cream; Apply  to affected area daily.  Dispense: 15 g; Refill: 0           Orders Placed This Encounter        ?  mometasone (ELOCON) 0.1 % topical cream             Sig: Apply  to affected area daily.         Dispense:  15 g             Refill:  0        I have reviewed the patient's past medical history, social history and family history and vitals.   We have discussed treatment plan and follow up and given patient instructions.  Patient's questions are answered and we will follow up as indicated.      Dictated using voice recognition software.  Proof read but unrecognized errors may exist.        Follow-up and Dispositions      ??  Return if symptoms worsen or fail to improve.                Noreene Filbert, PA-C

## 2019-11-06 ENCOUNTER — Encounter

## 2019-11-06 MED ORDER — MONTELUKAST 10 MG TAB
10 mg | ORAL_TABLET | ORAL | 11 refills | Status: AC
Start: 2019-11-06 — End: ?

## 2020-02-16 ENCOUNTER — Encounter

## 2020-02-26 ENCOUNTER — Encounter: Attending: Medical | Primary: Family Medicine

## 2020-03-01 NOTE — Telephone Encounter (Signed)
Telephone Encounter by Mylinda Latina B at 03/01/20 0935                Author: Mylinda Latina B  Service: --  Author Type: Technician       Filed: 03/01/20 0935  Encounter Date: 02/29/2020  Status: Signed          Editor: Judie Petit (Technician)          From: Isaac Bliss JohnsonTo: Berna Spare Dangler, MDSent: 02/29/2020  5:28 PM ESTSubject: Update Medical InformationHi Dr Patsi Sears got my COVID booster  vaccine on Saturday, Nov 6th.  However, I wanted to let you know that I have a slight reaction to the area where shot was given on the left arm (red and swolled around injection site-- about the size of a dime--but no heat). However, underneath that arm  towards the breast the lymph node is swollen and extremely sore (about the size of a golf ball). I felt that you might want to make note of this in my chart.  Let me know if I should do anything.Sally [name]

## 2020-03-01 NOTE — Telephone Encounter (Signed)
Replied to patient concerning her below side effect from the vaccine booster:    Thanks for letting me know. It should resolve over the next few weeks. If it does not, let me know.  I would also advise not going for mammogram until it is completely resolved.

## 2020-03-14 ENCOUNTER — Inpatient Hospital Stay: Admit: 2020-03-14 | Payer: BLUE CROSS/BLUE SHIELD | Attending: Family Medicine | Primary: Family Medicine

## 2020-03-14 DIAGNOSIS — Z1231 Encounter for screening mammogram for malignant neoplasm of breast: Secondary | ICD-10-CM

## 2020-05-06 ENCOUNTER — Ambulatory Visit: Attending: Family | Primary: Family Medicine

## 2020-05-06 ENCOUNTER — Ambulatory Visit: Attending: Orthopaedic Surgery | Primary: Family Medicine

## 2020-05-06 ENCOUNTER — Ambulatory Visit

## 2020-05-06 ENCOUNTER — Ambulatory Visit: Admit: 2020-05-06 | Discharge: 2020-05-06 | Payer: BLUE CROSS/BLUE SHIELD | Primary: Family Medicine

## 2020-05-06 ENCOUNTER — Encounter: Admit: 2020-05-06 | Discharge: 2020-05-06 | Payer: BLUE CROSS/BLUE SHIELD | Primary: Family Medicine

## 2020-05-06 ENCOUNTER — Ambulatory Visit
Admit: 2020-05-06 | Discharge: 2020-05-06 | Payer: BLUE CROSS/BLUE SHIELD | Attending: Orthopaedic Surgery | Primary: Family Medicine

## 2020-05-06 ENCOUNTER — Ambulatory Visit: Admit: 2020-05-06 | Discharge: 2020-05-06 | Payer: BLUE CROSS/BLUE SHIELD | Attending: Family | Primary: Family Medicine

## 2020-05-06 ENCOUNTER — Encounter

## 2020-05-06 DIAGNOSIS — M79642 Pain in left hand: Secondary | ICD-10-CM

## 2020-05-06 DIAGNOSIS — L819 Disorder of pigmentation, unspecified: Secondary | ICD-10-CM

## 2020-05-06 DIAGNOSIS — M79645 Pain in left finger(s): Secondary | ICD-10-CM

## 2020-05-06 MED ORDER — MELOXICAM 15 MG TAB
15 mg | ORAL_TABLET | Freq: Every day | ORAL | 1 refills | Status: DC
Start: 2020-05-06 — End: 2020-05-06

## 2020-05-06 NOTE — Progress Notes (Signed)
Patient was fit for a [facility] splint for patients left hand/joint. I demonstrated that the thumb slides into the opening and Velcro on the dorsal side of the hand. The strap continues around the thumb and Velcro's again on the ventral side of hand or palm, and then continues through the thumb and first finger to Velcro again on the dorsal side of hand. Patient read and signed documenting they understand and agree to [facility]'s current DME return policy.

## 2020-05-06 NOTE — Progress Notes (Signed)
Progress  Notes by Lenord Fellers, MD at 05/06/20 0915                Author: Lenord Fellers, MD  Service: --  Author Type: Physician       Filed: 05/06/20 0952  Encounter Date: 05/06/2020  Status: Signed          Editor: Lenord Fellers, MD (Physician)                       [facility] Note      Name: Sally [name]   Date of Birth: [birth year]   Gender: female   MRN: [id number]         CC: Patient referred for evaluation of upper extremity pain      HPI: Sally [name] is  a 50 y.o. female  right hand dominant with a chief complaint of left hand pain, left thumb pain, left index finger coldness, the patient reports that she had no symptoms prior to 2 weeks ago, starting 2 weeks ago when  the weather got cold she started noticing the left index finger turned purplish hue, it turns back pink when she goes into warm rooms but as soon as she goes into the cold weather it turns purple again, she also reports pain at the base of the left thumb,  she reports no pain at the base of the left thumb before this issue.         ROS/Meds/PSH/PMH/FH/SH: I personally reviewed the patients standard intake form.  Pertinents are discussed in the HPI      Physical Examination:   General: Awake and alert.   HEENT: Normocephalic, atraumatic   CV/Pulm: Breathing even and unlabored   Skin: No obvious rashes noted.   Lymphatic: No obvious evidence of lymphedema or lymphadenopathy      Musculoskeletal Exam:   Examination on the Left upper extremity demonstrates cap refill < 5 seconds in all fingers,  good cap refill in all fingers, of the left index finger does have a purplish hue and is slightly colder than the rest, patient reports this goes back to completely normal in warm rooms, she has a 2+ radial pulse, I cannot really palpate a ulnar pulse,  Allen test demonstrates cap refill of all fingers less than 3 seconds on the radial side but 7 seconds on the ulnar side, she has tenderness palpation of the left thumb  CMC joint with a positive grind test.      Imaging / Electrodiagnostic Tests:         Left Hand XR: AP, Lateral, Oblique and Thumb CMC joint          Clinical Indication:   1.  Pain of left thumb       2.  Raynaud's phenomenon without gangrene                      Report: AP, lateral, oblique and thumb CMC joint hand XRs demonstrates very mild subluxation of the left thumb CMC joint however on the Bennett view this appears to be well located  without significant narrowing or osteophytes      Impression: Normal x-ray of the left hand          Lenord Fellers, MD              Assessment:       1.  Pain of left thumb  2.  Raynaud's phenomenon without gangrene            Plan:    We discussed the diagnosis and different treatment options. We discussed observation, therapy, antiinflammatory medications and other pertinent treatment modalities.      After discussing in detail the patient elects to proceed with Comfort Cool for the left thumb, Mobic 15 daily for 2 weeks, I discussed the case with the vascular surgery nurse practitioner, she  will be evaluated for arterial studies, this is Raynaud phenomenon and I really cannot palpate a ulnar pulse however she does have cap refill of 7 seconds on that side with Freida Busman test.  We discussed the significance of Raynaud phenomenon, we discussed  that some of the pain at the thumb may be related to early osteoarthritis however the patient reports that she did not have any issues there before 2 weeks ago, we will reassess after vascular surgery evaluation if necessary but I have suspicion that  most if not all her complaints have to do with circulatory problem.       Patient voiced accordance and understanding of the information provided and the formulated plan. All questions were answered to the patient's satisfaction during the encounter.      4 This is a undiagnosed new problem with uncertain prognosis   Treatment at this time: Prescription Drug Management      Lenord Fellers, MD   Orthopaedic Surgery   05/06/20   9:46 AM

## 2020-05-06 NOTE — Progress Notes (Signed)
Progress Notes by Yichen Gilardi, Lise Auer, NP at 05/06/20 1200                Author: Sallee Lange, NP  Service: --  Author Type: Nurse Practitioner       Filed: 05/06/20 1316  Encounter Date: 05/06/2020  Status: Signed          Editor: Erendira Crabtree, Lise Auer, NP (Nurse Practitioner)                           DATE OF VISIT: 05/06/2020         REFERRING PHYSICIAN: Lenord Fellers, MD   [address]   [place],  [place] [address]      REASON FOR REFERRAL: discoloration of the left 2nd finger               Sally [name] is a 50 y.o.  female seen in consultation for discoloration of the left 2nd finger. She states this happened acutely on Saturday evening while visiting her  father in [place]. She has never had anything happen like this before. She has no history of injury or trauma and has never been a smoker. No history of clotting disorders.      PMH: kidney stones, thyroid disease, headaches.      Chief Complaint       Patient presents with        ?  New Patient     ?  Results     ?  Ultrasound             BLE arterial                     MEDICAL HISTORY:      Past Medical History:        Diagnosis  Date         ?  Abnormal Pap smear       ?  Calculus of kidney       ?  Cough  03/25/2014     ?  Headache           ?  Thyroid disease             FAMILY HISTORYF:      Family History         Problem  Relation  Age of Onset          ?  Diabetes  Maternal Grandmother       ?  Cancer  Maternal Grandmother  4              lung cancer - non smoker          ?  Lung Disease  Maternal Grandmother       ?  Diabetes  Paternal Grandmother       ?  Diabetes  Paternal Grandfather       ?  Cancer  Maternal Grandfather                cirrhosis          ?  Hypertension  Mother       ?  Thyroid Disease  Mother       ?  Elevated Lipids  Mother       ?  Gall Bladder Disease  Mother       ?  Migraines  Mother            ?  Diabetes  Father             SURGICAL HISTORY:      Past Surgical History:         Procedure  Laterality   Date          ?  HX CHOLECYSTECTOMY         ?  HX ORTHOPAEDIC    2011          left plantar fasciitis surgery          ?  HX ORTHOPAEDIC    1999          left 3rd finger cyst removed          ?  HX TONSILLECTOMY               Review of Systems    A comprehensive review of systems was negative except for that written in the HPI      ALLERGIES:      Allergies        Allergen  Reactions         ?  Codeine  Nausea and Vomiting           CURRENT MEDICATIONS:     Current Outpatient Medications         Medication  Sig  Dispense          ?  montelukast (SINGULAIR) 10 mg tablet  TAKE 1 TABLET BY MOUTH AT BEDTIME  30 Tablet     ?  mometasone (ELOCON) 0.1 % topical cream  Apply  to affected area daily.  15 g     ?  rizatriptan (MAXALT-MLT) 10 mg disintegrating tablet  DISSOLVE ONE TABLET BY MOUTH ONCE AS NEEDED FOR MIGRAINE FOR UP TO 1 DOSE  12 Tab     ?  levothyroxine (SYNTHROID) 50 mcg tablet  Take 50 mcg by mouth. Alternate with .75 mcg       ?  rizatriptan (MAXALT) 10 mg tablet  Take 10 mg by mouth once as needed for Migraine. May repeat in 2 hours if needed       ?  fluticasone (FLONASE) 50 mcg/actuation nasal spray  2 sprays each nostril daily  1 Bottle     ?  clindamycin (CLEOCIN T) 1 % external solution  Apply  to affected area two (2) times a day. use thin film on affected area       ?  levonorgestrel (MIRENA) 20 mcg/24 hr (5 years) IUD  1 Each by IntraUTERine route.       ?  levothyroxine (SYNTHROID) 100 mcg tablet  Take 1 Tab by mouth Daily (before breakfast). (Patient taking differently: Take 75 mcg by mouth Daily (before breakfast).)  90 Tab          ?  cetirizine (ZYRTEC) 10 mg tablet  Take  by mouth daily.            No current facility-administered medications for this visit.              SOCIAL HISTORY:      Social History          Tobacco Use        Smoking Status  Never Smoker        Smokeless Tobacco  Never Used  Social History          Substance and Sexual  Activity        Alcohol Use  No              IMAGING: Interpretation Summary      ??  Left upper extremity: The wrist-brachial index (WBI) is 0.97. The upper extremity arterial duplex reveals no significant arterial disease.  The 2nd symptomatic digit pressure is .          Carotid Measurements            Left PSV  Left EDV         Vertebral  99.6 cm/s                  31.8 cm/s                         Subclavian  148 cm/s                  11.7 cm/s                     Subclavian Prox  148 cm/s              11.7 cm/s                 Subclavian Mid  122 cm/s              0 cm/s                    Upper Arterial Measurements            Left PSV  Left EDV         Axillary  108 cm/s                           Brach Prox  104 cm/s                  0 cm/s                     Brach Dist  127 cm/s              0 cm/s                 Radial Prox  74.6 cm/s                   Radial Mid  81.5 cm/s                   Radial Dist  81.5 cm/s                   Ulnar Dist  51.6 cm/s                      Arterial Pressure Measurements            Right  Left         1st Digit  118 mmHg                  116 mmHg                         2nd Digit  114 mmHg  0 mmHg                     3rd Digit  102 mmHg              111 mmHg                 DBI (3rd Digit)  0.78               0.85                  4th Digit  106 mmHg              107 mmHg                 5th Digit  119 mmHg              113 mmHg                 Radial BP  124 mmHg              127 mmHg                 Ulnar BP  124 mmHg              121 mmHg                 Brachial BP  126 mmHg              131 mmHg                 WBI  0.95               0.97                                                                                      Procedure Staff        Technologist/Clinician: Wandra Scot, RDMS   Supporting Staff: None   Performing Physician/Midlevel: None   ??   Exam Completion Date/Time: 05/06/20 12:13 PM                 PHYSICAL EXAM   VITALS:       Vitals:          05/06/20 1211        BP:  (!) 144/87     BP 1 Location:  Left arm     BP Patient Position:  Sitting     BP Cuff Size:  Small adult     Pulse:  89     Temp:  97.7 ??F (36.5 ??C)     TempSrc:  Temporal     Height:  5\' 1"  (1.549 m)     Weight:  180 lb (81.6 kg)        SpO2:  96%            GENERAL: Well developed, well nourished 50 y.o. in no acute distress   HEAD/NECK: normocephalic, atraumatic, neck supple   LUNGS: clear to auscultation bilaterally   HEART: Regular rate and rhythm   ABDOMEN: soft, nontender, nondistended   EXTREMITIES: upper without CCE  with palpable radial and brachial pulses.    MUSCULOSKELETAL: normal gait   NEURO: sensation and strength grossly intact and symmetrical   PSYCH: alert and oriented to person, place and time         Impression/Plan: Patient is a 50 year old female who is being evaluated for new onset left thumb and index finger discoloration. This happened this past Saturday night while visiting her dad in [place]. No injury or trauma. This has never happened before.  Her duplex study does show adequate flow, however, she has a dampened wave form in her index finger. This could be related to the cold weather and vasoconstriction. Will have her start taking an 81 mg ASA and will order a CTA chest to fully evaluate her  subclavian arteries and arch. Will have her follow back up after the CTA. Dr. Mayford KnifeWilliams did call and discuss this with Dr. Wyvonnia LoraNunez.         Patient was seen and examined with Dr. Judye Bosavid T Williams who formulated the treatment plan         Lise AuerJennifer L Birdena Kingma, NP       Elements of this note have been dictated using speech recognition software. As a result, errors of speech recognition may have occurred.

## 2020-05-09 LAB — VAS DUP UPPER EXT ARTERIES LEFT W WBI
Left 1st Digit BP: 116 mmHg
Left 2nd Digit BP: 0 mmHg
Left 3rd Digit BP: 111 mmHg
Left 4th Digit BP: 107 mmHg
Left 5th Digit BP: 113 mmHg
Left Brachial A dist EDV: 0 cm/s
Left Brachial A dist PSV: 127 cm/s
Left Brachial A prox EDV: 0 cm/s
Left Brachial A prox PSV: 104 cm/s
Left DBI 3rd Digit: 0.85
Left Mid Ax A PSV: 108 cm/s
Left Prox Radial A BP: 127 mmHg
Left Prox Ulnar A BP: 121 mmHg
Left Radial A dist PSV: 81.5 cm/s
Left Radial A mid PSV: 81.5 cm/s
Left Radial A prox PSV: 74.6 cm/s
Left Ulnar A dist PSV: 51.6 cm/s
Left WBI: 0.97
Left arm BP: 131 mmHg
Left subclavian EDV: 11.7 cm/s
Left subclavian PSV: 148 cm/s
Left subclavian mid EDV: 0 cm/s
Left subclavian mid PSV: 122 cm/s
Left subclavian prox EDV: 11.7 cm/s
Left subclavian prox PSV: 148 cm/s
Left vertebral EDV: 31.8 cm/s
Left vertebral PSV: 99.6 cm/s
Right 1st Digit BP: 118 mmHg
Right 2nd Digit BP: 114 mmHg
Right 3rd Digit BP: 102 mmHg
Right 4th Digit BP: 106 mmHg
Right 5th Digit BP: 119 mmHg
Right DBI 3rd DIGIT: 0.78
Right Prox Radial A BP: 124 mmHg
Right Prox Ulnar A BP: 124 mmHg
Right WBI: 0.95
Right arm BP: 126 mmHg

## 2020-05-09 LAB — DUPLEX UPPER EXT ART LEFT W WBI
LEFT SUBCLAVIAN ARTERY D: 11.7 cm/s
LEFT VERTEBRAL ARTERY D: 31.8 cm/s
Left 1st Digit BP: 116 mmHg
Left 2nd Digit BP: 0 mmHg
Left 3rd Digit BP: 111 mmHg
Left 4th Digit BP: 107 mmHg
Left 5th Digit BP: 113 mmHg
Left DBI 3rd Digit: 0.85
Left Dist Brachial A EDV: 0 cm/s
Left Dist Brachial A PSV: 127 cm/s
Left Dist Radial A PSV: 81.5 cm/s
Left Dist Ulnar A PSV: 51.6 cm/s
Left Mid Ax A PSV: 108 cm/s
Left Mid Radial A PSV: 81.5 cm/s
Left Prox Brachial A EDV: 0 cm/s
Left Prox Brachial A PSV: 104 cm/s
Left Prox Radial A BP: 127 mmHg
Left Prox Radial A PSV: 74.6 cm/s
Left Prox Ulnar A BP: 121 mmHg
Left WBI: 0.97
Left arm BP: 131 mmHg
Left subclavian mid EDV: 0 cm/s
Left subclavian mid PSV: 122 cm/s
Left subclavian prox EDV: 11.7 cm/s
Left subclavian prox PSV: 148 cm/s
Left subclavian sys: 148 cm/s
Left vertebral sys: 99.6 cm/s
Right 1st Digit BP: 118 mmHg
Right 2nd Digit BP: 114 mmHg
Right 3rd Digit BP: 102 mmHg
Right 4th Digit BP: 106 mmHg
Right 5th Digit BP: 119 mmHg
Right DBI 3rd DIGIT: 0.78
Right Prox Radial A BP: 124 mmHg
Right Prox Ulnar A BP: 124 mmHg
Right WBI: 0.95
Right arm BP: 126 mmHg

## 2020-05-13 ENCOUNTER — Inpatient Hospital Stay: Admit: 2020-05-13 | Payer: BLUE CROSS/BLUE SHIELD | Attending: Family | Primary: Family Medicine

## 2020-05-13 DIAGNOSIS — L819 Disorder of pigmentation, unspecified: Secondary | ICD-10-CM

## 2020-05-13 MED ORDER — SODIUM CHLORIDE 0.9% BOLUS IV
0.9 % | Freq: Once | INTRAVENOUS | Status: AC
Start: 2020-05-13 — End: 2020-05-13
  Administered 2020-05-13: 20:00:00 via INTRAVENOUS

## 2020-05-13 MED ORDER — SALINE PERIPHERAL FLUSH PRN
Freq: Once | INTRAMUSCULAR | Status: AC
Start: 2020-05-13 — End: 2020-05-13
  Administered 2020-05-13: 20:00:00

## 2020-05-13 MED ORDER — IOPAMIDOL 76 % IV SOLN
76 % | Freq: Once | INTRAVENOUS | Status: AC
Start: 2020-05-13 — End: 2020-05-13
  Administered 2020-05-13: 20:00:00 via INTRAVENOUS

## 2020-05-17 ENCOUNTER — Ambulatory Visit: Attending: Vascular Surgery | Primary: Family Medicine

## 2020-05-17 ENCOUNTER — Ambulatory Visit
Admit: 2020-05-17 | Discharge: 2020-05-17 | Payer: BLUE CROSS/BLUE SHIELD | Attending: Vascular Surgery | Primary: Family Medicine

## 2020-05-17 DIAGNOSIS — I739 Peripheral vascular disease, unspecified: Secondary | ICD-10-CM

## 2020-05-17 NOTE — Progress Notes (Signed)
Progress Notes by Judye Bos, MD at 05/17/20 0830                Author: Judye Bos, MD  Service: --  Author Type: Physician       Filed: 05/17/20 0827  Encounter Date: 05/17/2020  Status: Signed          Editor: Judye Bos, MD (Physician)                       [address]    [address], [place]. [address]   [phone] FAX: [phone]      [name]   DOB: Jul 01, 1970      Chief Complaint:       History of Present Illness   Patient follows up today for follow-up with some discoloration of her left second finger.  Patient's been on aspirin denies any pain in her left hand      CURRENT MEDICATIONS:     Current Outpatient Medications         Medication  Sig  Dispense          ?  aspirin delayed-release 81 mg tablet  Take  by mouth daily.       ?  montelukast (SINGULAIR) 10 mg tablet  TAKE 1 TABLET BY MOUTH AT BEDTIME  30 Tablet     ?  mometasone (ELOCON) 0.1 % topical cream  Apply  to affected area daily.  15 g     ?  rizatriptan (MAXALT-MLT) 10 mg disintegrating tablet  DISSOLVE ONE TABLET BY MOUTH ONCE AS NEEDED FOR MIGRAINE FOR UP TO 1 DOSE  12 Tab     ?  levothyroxine (SYNTHROID) 50 mcg tablet  Take 50 mcg by mouth. Alternate with .75 mcg       ?  rizatriptan (MAXALT) 10 mg tablet  Take 10 mg by mouth once as needed for Migraine. May repeat in 2 hours if needed       ?  fluticasone (FLONASE) 50 mcg/actuation nasal spray  2 sprays each nostril daily  1 Bottle     ?  clindamycin (CLEOCIN T) 1 % external solution  Apply  to affected area two (2) times a day. use thin film on affected area       ?  levonorgestrel (MIRENA) 20 mcg/24 hr (5 years) IUD  1 Each by IntraUTERine route.            ?  levothyroxine (SYNTHROID) 100 mcg tablet  Take 1 Tab by mouth Daily (before breakfast). (Patient taking differently: Take 75 mcg by mouth Daily (before breakfast).)  90 Tab          ?  cetirizine (ZYRTEC) 10 mg tablet  Take  by mouth daily.            No current  facility-administered medications for this visit.             Past Medical History:        Diagnosis  Date         ?  Abnormal Pap smear       ?  Calculus of kidney       ?  Cough  03/25/2014     ?  Headache           ?  Thyroid disease             Physical Examination:  Vitals:          05/17/20 0820        BP:  138/85     BP 1 Location:  Left upper arm     BP Patient Position:  Sitting     BP Cuff Size:  Adult     Pulse:  92     Temp:  97 ??F (36.1 ??C)     TempSrc:  Temporal     Height:  5' 1.5" (1.562 m)     Weight:  180 lb (81.6 kg)        SpO2:  97%            Constitutional: she appears well-developed. No distress.    HENT:    Head: Atraumatic.    Eyes: Pupils are equal, round, and reactive to light.    Neck: Normal range of motion.    Cardiovascular: Regular rhythm.     Pulmonary/Chest: Effort normal and breath sounds normal. No respiratory distress.    Abdominal: Soft. Bowel sounds are normal. she exhibits no distension. There is no tenderness. There is no guarding. No hernia.   Musculoskeletal:  Normal range of motion.   Neurological: She is alert. CN II- XII grossly intact    Vascular: Has palpable radial pulses palpable ulnar pulses no discoloration      Imaging:               Recommendations/Plans:    [name] is a  50 y.o. year old female with discoloration of her left second finger CTA showed no evidence  of any large vessel disease.  Duplex study performed last week showed biphasic and triphasic flow down to the hand with 0 pressure on the second finger.  Do not see any evidence of any atherosclerotic disease.  I will recommend continue aspirin follow-up  as needed.      Judye Bos, MD      Elements of this note have been dictated using speech recognition software. As a result, errors of speech recognition may have occurred.

## 2020-10-31 MED ORDER — MONTELUKAST SODIUM 10 MG PO TABS
10 MG | ORAL_TABLET | ORAL | 2 refills | Status: AC
Start: 2020-10-31 — End: ?

## 2020-12-21 NOTE — Telephone Encounter (Signed)
[facility] requesting a refill on the following medications to be sent in to them..          montelukast (SINGULAIR) 10 MG tablet [[id number]]

## 2020-12-21 NOTE — Telephone Encounter (Signed)
Not a current medication

## 2021-03-02 MED ORDER — RIZATRIPTAN BENZOATE 10 MG PO TBDP
10 MG | ORAL_TABLET | ORAL | 5 refills | Status: AC
Start: 2021-03-02 — End: ?

## 2021-03-02 NOTE — Telephone Encounter (Signed)
Last OV 10/02/19  Last rx 02/2019
